# Patient Record
Sex: Female | Born: 1970 | Race: White | Hispanic: No | Marital: Married | Smoking: Never smoker
Health system: Southern US, Community
[De-identification: ages and names within clinical notes are randomized; demographics above are authoritative.]

## PROBLEM LIST (undated history)

## (undated) DIAGNOSIS — N83209 Unspecified ovarian cyst, unspecified side: Secondary | ICD-10-CM

## (undated) DIAGNOSIS — Z5189 Encounter for other specified aftercare: Secondary | ICD-10-CM

## (undated) DIAGNOSIS — A630 Anogenital (venereal) warts: Secondary | ICD-10-CM

## (undated) DIAGNOSIS — B977 Papillomavirus as the cause of diseases classified elsewhere: Secondary | ICD-10-CM

## (undated) DIAGNOSIS — E785 Hyperlipidemia, unspecified: Secondary | ICD-10-CM

## (undated) DIAGNOSIS — R030 Elevated blood-pressure reading, without diagnosis of hypertension: Secondary | ICD-10-CM

## (undated) DIAGNOSIS — Z87442 Personal history of urinary calculi: Secondary | ICD-10-CM

## (undated) DIAGNOSIS — D649 Anemia, unspecified: Secondary | ICD-10-CM

## (undated) DIAGNOSIS — C73 Malignant neoplasm of thyroid gland: Secondary | ICD-10-CM

## (undated) HISTORY — PX: FRACTURE SURGERY: SHX138

## (undated) HISTORY — DX: Encounter for other specified aftercare: Z51.89

## (undated) HISTORY — PX: BREAST SURGERY: SHX581

## (undated) HISTORY — DX: Anemia, unspecified: D64.9

## (undated) HISTORY — PX: COSMETIC SURGERY: SHX468

---

## 1999-10-31 HISTORY — PX: BREAST ENHANCEMENT SURGERY: SHX7

## 2002-10-07 ENCOUNTER — Other Ambulatory Visit: Admission: RE | Admit: 2002-10-07 | Discharge: 2002-10-07 | Payer: Self-pay | Admitting: Obstetrics and Gynecology

## 2003-04-06 ENCOUNTER — Inpatient Hospital Stay (HOSPITAL_COMMUNITY): Admission: AD | Admit: 2003-04-06 | Discharge: 2003-04-08 | Payer: Self-pay | Admitting: Obstetrics and Gynecology

## 2003-04-06 ENCOUNTER — Encounter: Payer: Self-pay | Admitting: Obstetrics and Gynecology

## 2003-04-06 ENCOUNTER — Encounter (INDEPENDENT_AMBULATORY_CARE_PROVIDER_SITE_OTHER): Payer: Self-pay

## 2003-05-19 ENCOUNTER — Other Ambulatory Visit: Admission: RE | Admit: 2003-05-19 | Discharge: 2003-05-19 | Payer: Self-pay | Admitting: Obstetrics and Gynecology

## 2003-12-01 ENCOUNTER — Other Ambulatory Visit: Admission: RE | Admit: 2003-12-01 | Discharge: 2003-12-01 | Payer: Self-pay | Admitting: Obstetrics and Gynecology

## 2005-02-03 ENCOUNTER — Encounter (INDEPENDENT_AMBULATORY_CARE_PROVIDER_SITE_OTHER): Payer: Self-pay | Admitting: Specialist

## 2005-02-03 ENCOUNTER — Ambulatory Visit (HOSPITAL_COMMUNITY): Admission: RE | Admit: 2005-02-03 | Discharge: 2005-02-03 | Payer: Self-pay | Admitting: Obstetrics and Gynecology

## 2005-02-03 HISTORY — PX: DILATION AND CURETTAGE OF UTERUS: SHX78

## 2005-09-04 ENCOUNTER — Ambulatory Visit (HOSPITAL_COMMUNITY): Admission: RE | Admit: 2005-09-04 | Discharge: 2005-09-04 | Payer: Self-pay | Admitting: Obstetrics and Gynecology

## 2006-09-18 ENCOUNTER — Inpatient Hospital Stay (HOSPITAL_COMMUNITY): Admission: AD | Admit: 2006-09-18 | Discharge: 2006-09-20 | Payer: Self-pay | Admitting: Obstetrics and Gynecology

## 2007-07-11 ENCOUNTER — Observation Stay (HOSPITAL_COMMUNITY): Admission: AD | Admit: 2007-07-11 | Discharge: 2007-07-12 | Payer: Self-pay | Admitting: Obstetrics and Gynecology

## 2007-07-11 ENCOUNTER — Inpatient Hospital Stay (HOSPITAL_COMMUNITY): Admission: AD | Admit: 2007-07-11 | Discharge: 2007-07-11 | Payer: Self-pay | Admitting: Obstetrics and Gynecology

## 2007-09-30 HISTORY — PX: TUBAL LIGATION: SHX77

## 2007-11-09 ENCOUNTER — Ambulatory Visit (HOSPITAL_COMMUNITY): Admission: RE | Admit: 2007-11-09 | Discharge: 2007-11-09 | Payer: Self-pay | Admitting: Obstetrics and Gynecology

## 2007-12-02 ENCOUNTER — Ambulatory Visit (HOSPITAL_COMMUNITY): Admission: RE | Admit: 2007-12-02 | Discharge: 2007-12-02 | Payer: Self-pay | Admitting: Obstetrics and Gynecology

## 2007-12-17 ENCOUNTER — Ambulatory Visit (HOSPITAL_COMMUNITY): Admission: RE | Admit: 2007-12-17 | Discharge: 2007-12-17 | Payer: Self-pay | Admitting: Obstetrics and Gynecology

## 2008-01-04 ENCOUNTER — Ambulatory Visit (HOSPITAL_COMMUNITY): Admission: RE | Admit: 2008-01-04 | Discharge: 2008-01-04 | Payer: Self-pay | Admitting: Obstetrics and Gynecology

## 2008-01-26 ENCOUNTER — Ambulatory Visit (HOSPITAL_COMMUNITY): Admission: RE | Admit: 2008-01-26 | Discharge: 2008-01-26 | Payer: Self-pay | Admitting: Obstetrics and Gynecology

## 2008-02-22 ENCOUNTER — Ambulatory Visit (HOSPITAL_COMMUNITY): Admission: RE | Admit: 2008-02-22 | Discharge: 2008-02-22 | Payer: Self-pay | Admitting: Obstetrics and Gynecology

## 2008-02-24 ENCOUNTER — Emergency Department: Payer: Self-pay | Admitting: Unknown Physician Specialty

## 2008-02-29 ENCOUNTER — Ambulatory Visit: Payer: Self-pay | Admitting: Obstetrics and Gynecology

## 2008-03-06 ENCOUNTER — Ambulatory Visit: Payer: Self-pay | Admitting: Obstetrics and Gynecology

## 2008-03-15 ENCOUNTER — Ambulatory Visit: Payer: Self-pay | Admitting: Family Medicine

## 2008-03-22 ENCOUNTER — Ambulatory Visit: Payer: Self-pay | Admitting: Obstetrics & Gynecology

## 2008-03-29 ENCOUNTER — Ambulatory Visit: Payer: Self-pay | Admitting: Obstetrics & Gynecology

## 2008-04-05 ENCOUNTER — Ambulatory Visit: Payer: Self-pay | Admitting: Obstetrics & Gynecology

## 2008-04-10 ENCOUNTER — Inpatient Hospital Stay (HOSPITAL_COMMUNITY): Admission: AD | Admit: 2008-04-10 | Discharge: 2008-04-13 | Payer: Self-pay | Admitting: Obstetrics and Gynecology

## 2008-04-10 ENCOUNTER — Encounter (INDEPENDENT_AMBULATORY_CARE_PROVIDER_SITE_OTHER): Payer: Self-pay | Admitting: Obstetrics and Gynecology

## 2008-11-02 IMAGING — US US OB FOLLOW-UP
1 series · 18 of 24 positions shown · non-contrast
Comparison: none

OBSTETRICAL ULTRASOUND:
 This ultrasound was performed in The [HOSPITAL], and the [facility] report will be stored to [REDACTED] PACS.

[Series 1: us ob follow-up · 18 of 24 slices shown]
[im 1/24]
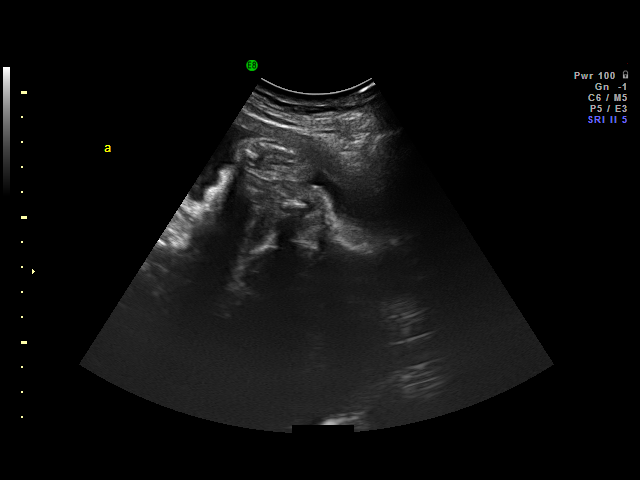
[im 3/24]
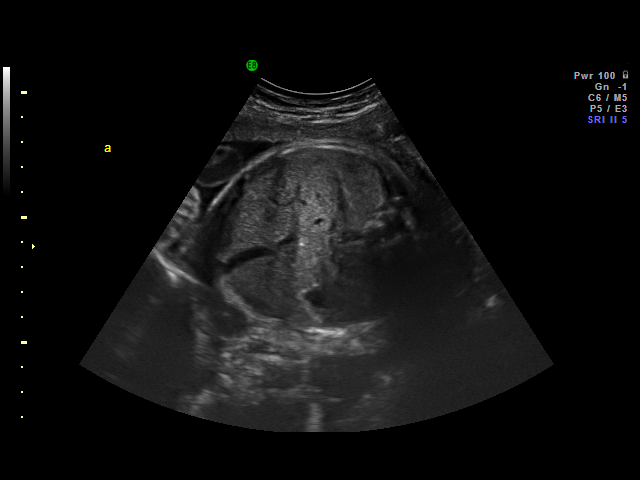
[im 4/24]
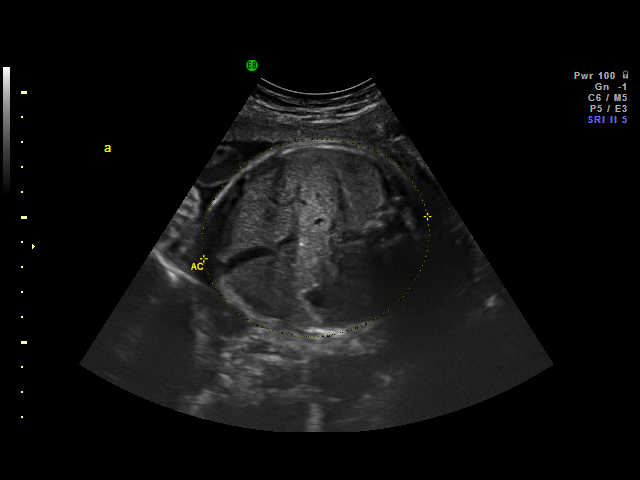
[im 5/24]
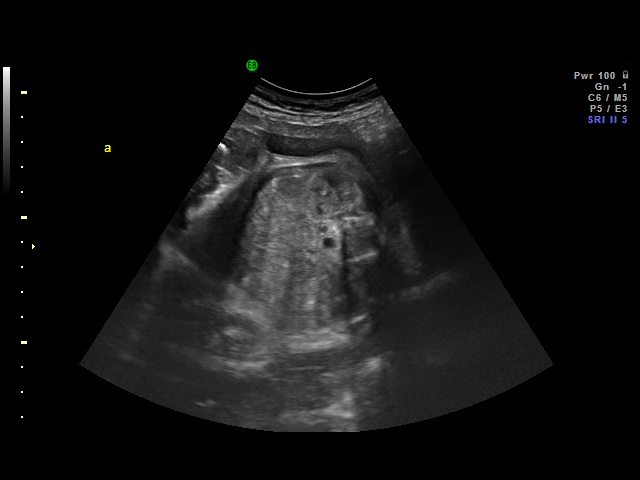
[im 7/24]
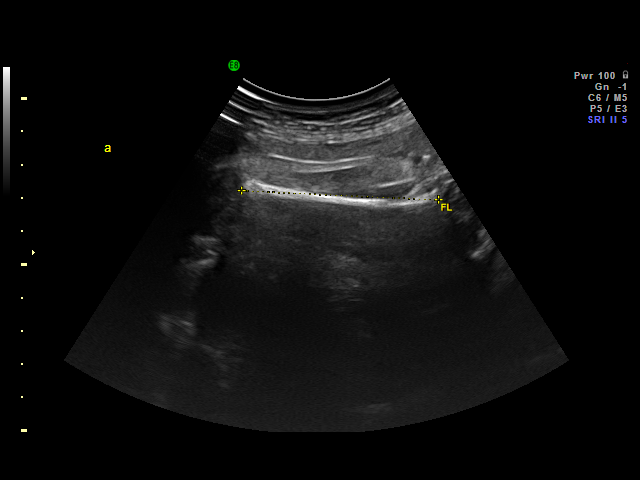
[im 8/24]
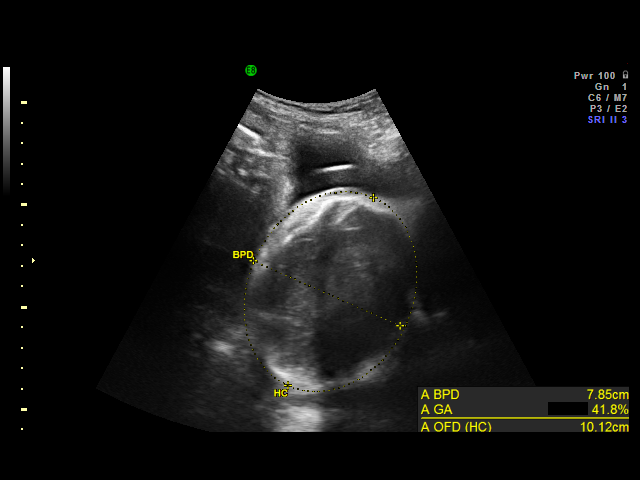
[im 9/24]
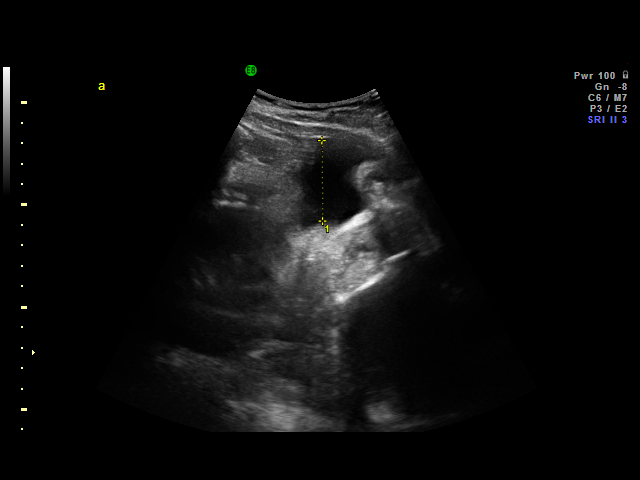
[im 11/24]
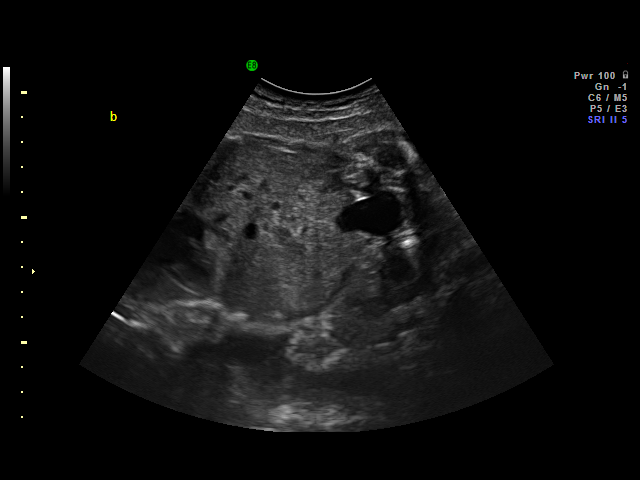
[im 12/24]
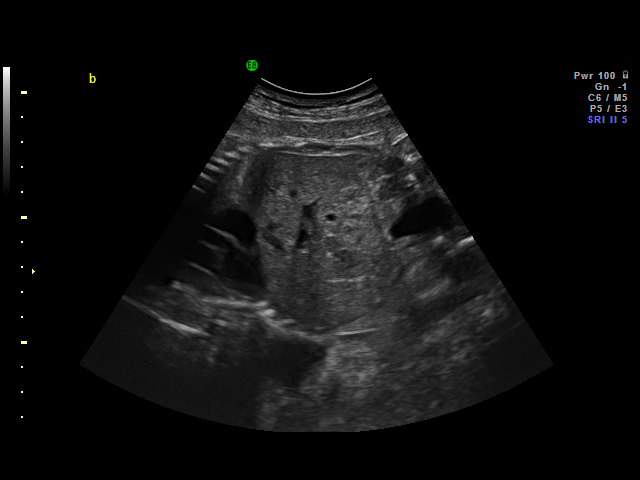
[im 13/24]
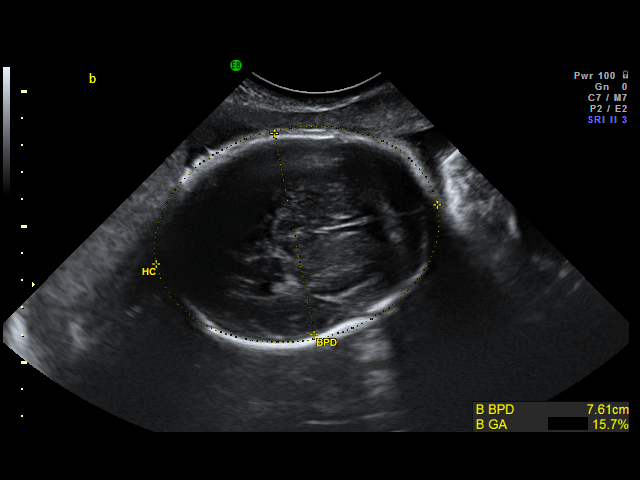
[im 15/24]
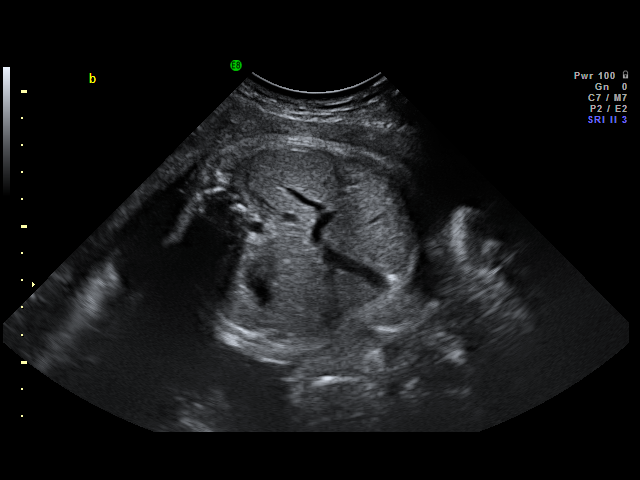
[im 16/24]
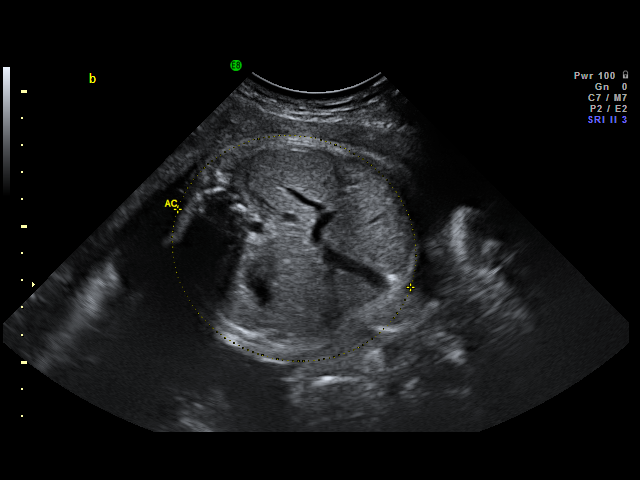
[im 17/24]
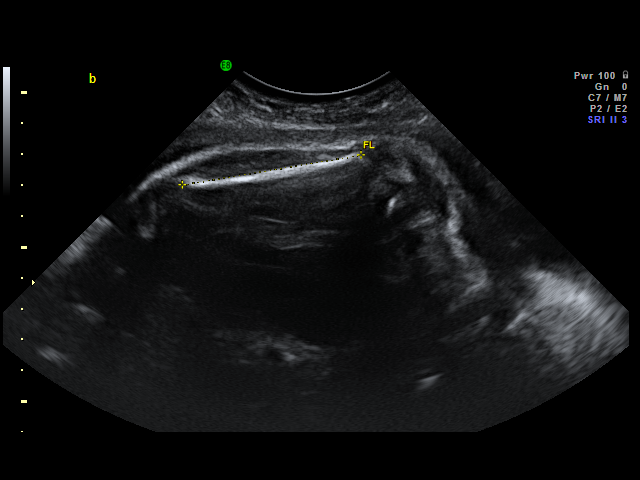
[im 19/24]
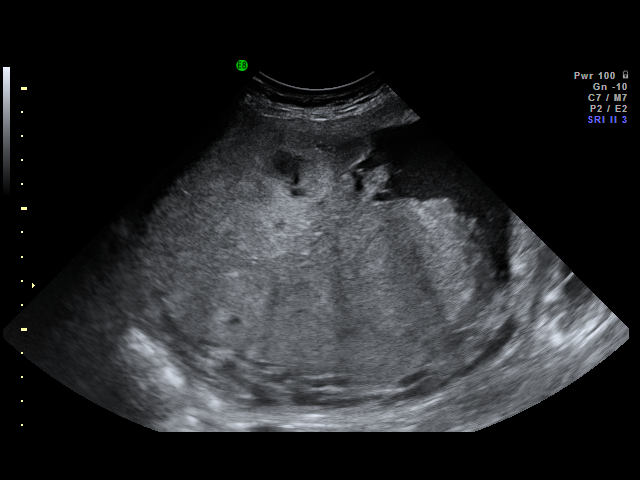
[im 20/24]
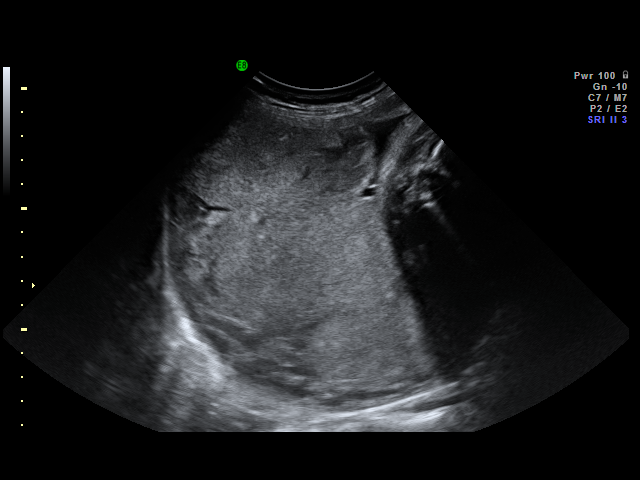
[im 21/24]
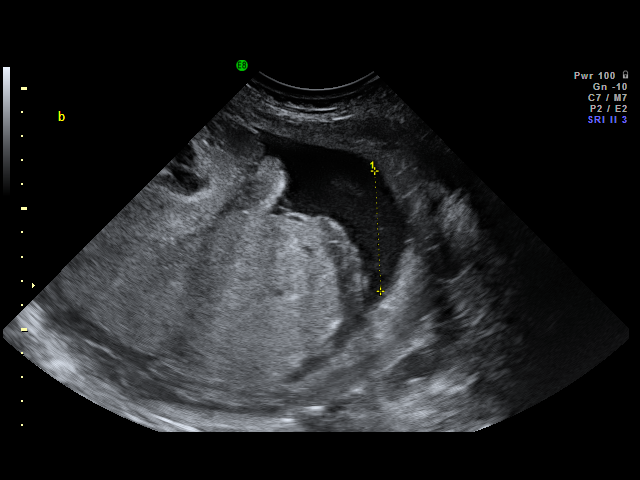
[im 23/24]
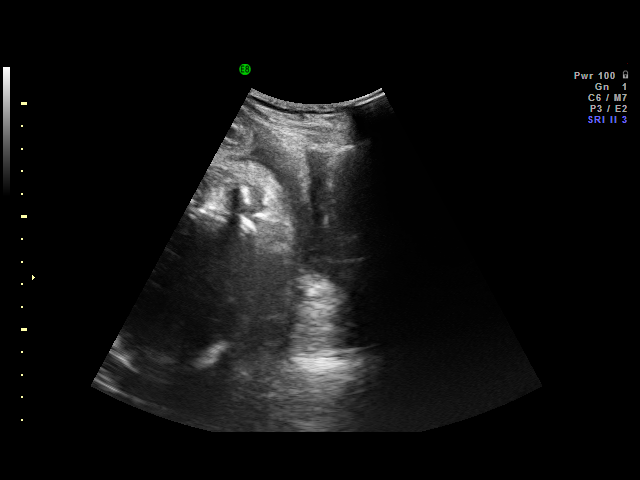
[im 24/24]
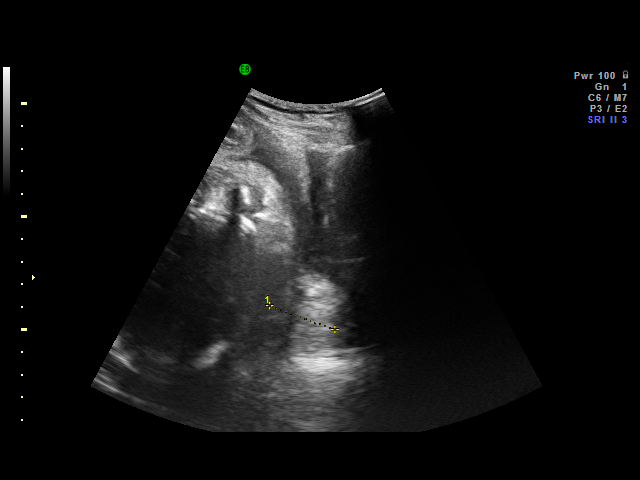

[18 of 24 positions shown; findings below may reference images not displayed]

IMPRESSION: The [facility] report has also been faxed to the ordering physician.

## 2008-11-04 IMAGING — CR DG ANKLE 2V *L*
1 series · 2 of 2 positions shown · non-contrast
Comparison: none

REASON FOR EXAM: injury, unable to wt. bear.  Pt is 32 wks preg.  Shield
abd   Minor Care 4
COMMENTS:   LMP: pregnant

PROCEDURE:     DXR - DXR ANKLE LEFT AP AND LATERAL  - February 24, 2008  [DATE]
RESULT:     AP and lateral views of the LEFT ankle reveal the bones to be
adequately mineralized. There is no evidence of fracture nor dislocation on
these two views.

[Series 1: view not recorded · 0.17mm/px · 2 of 2 slices shown]
[im 1/2]
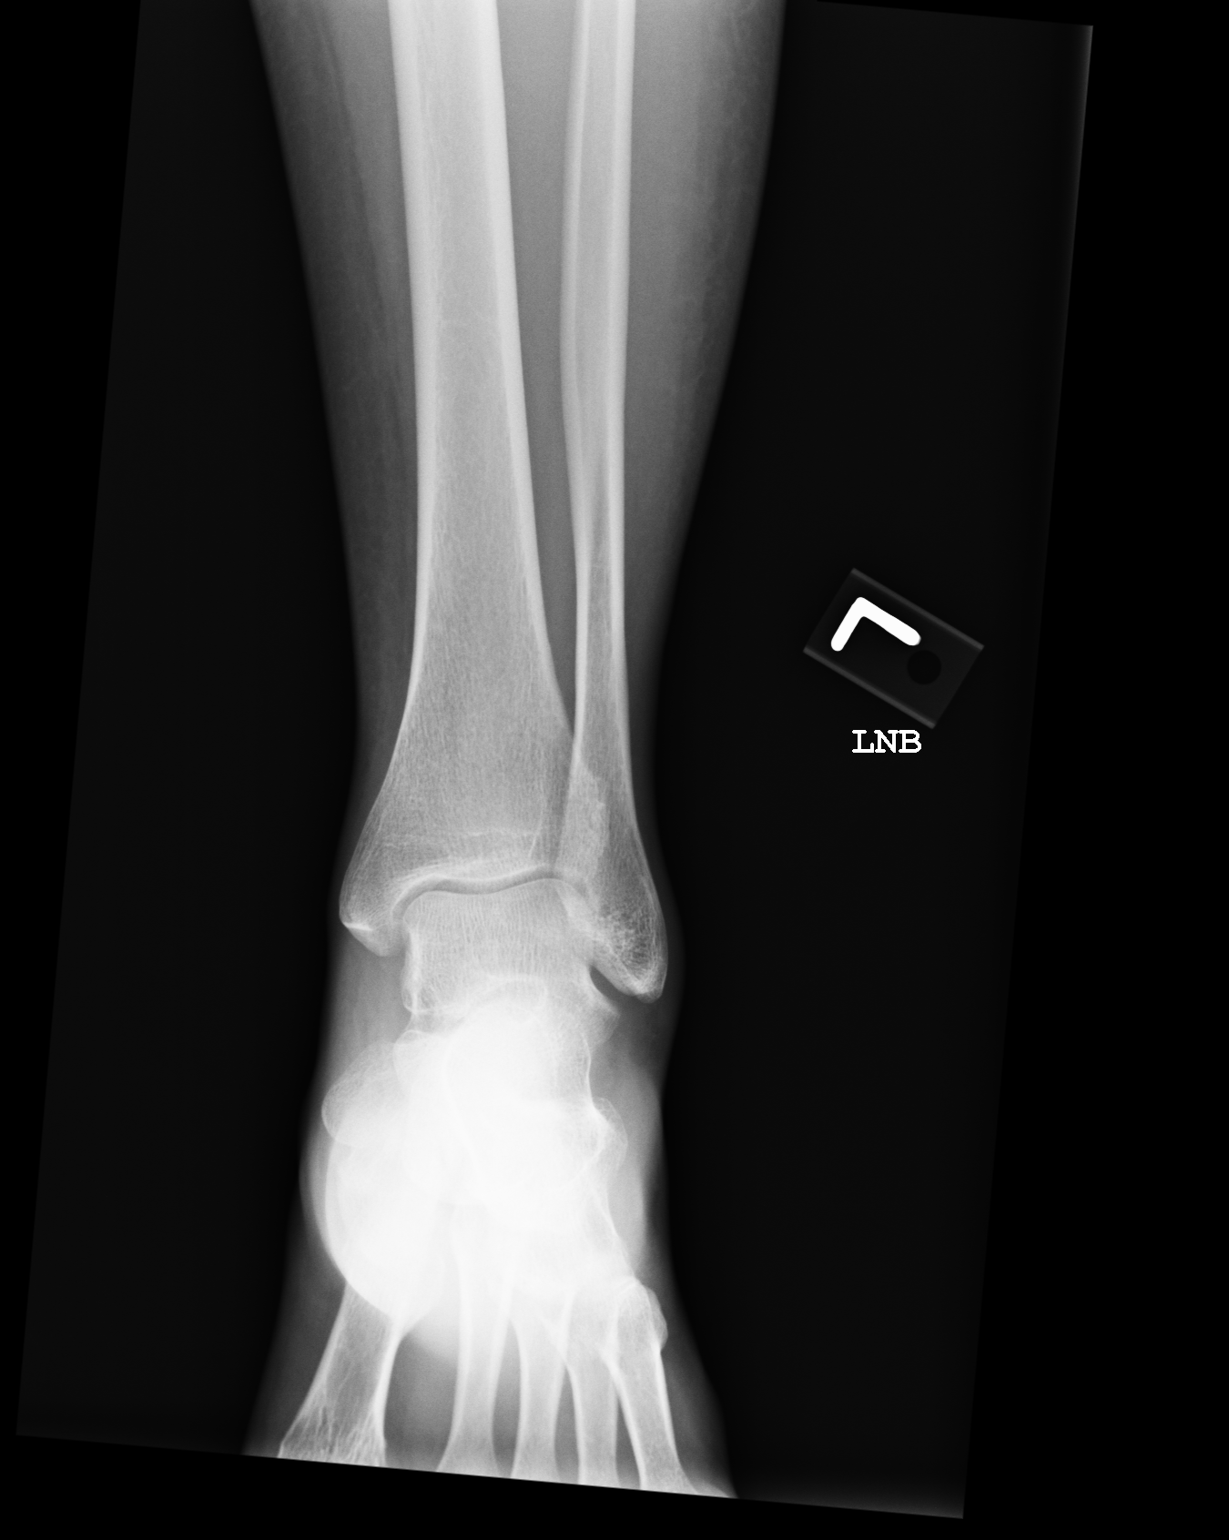
[im 2/2]
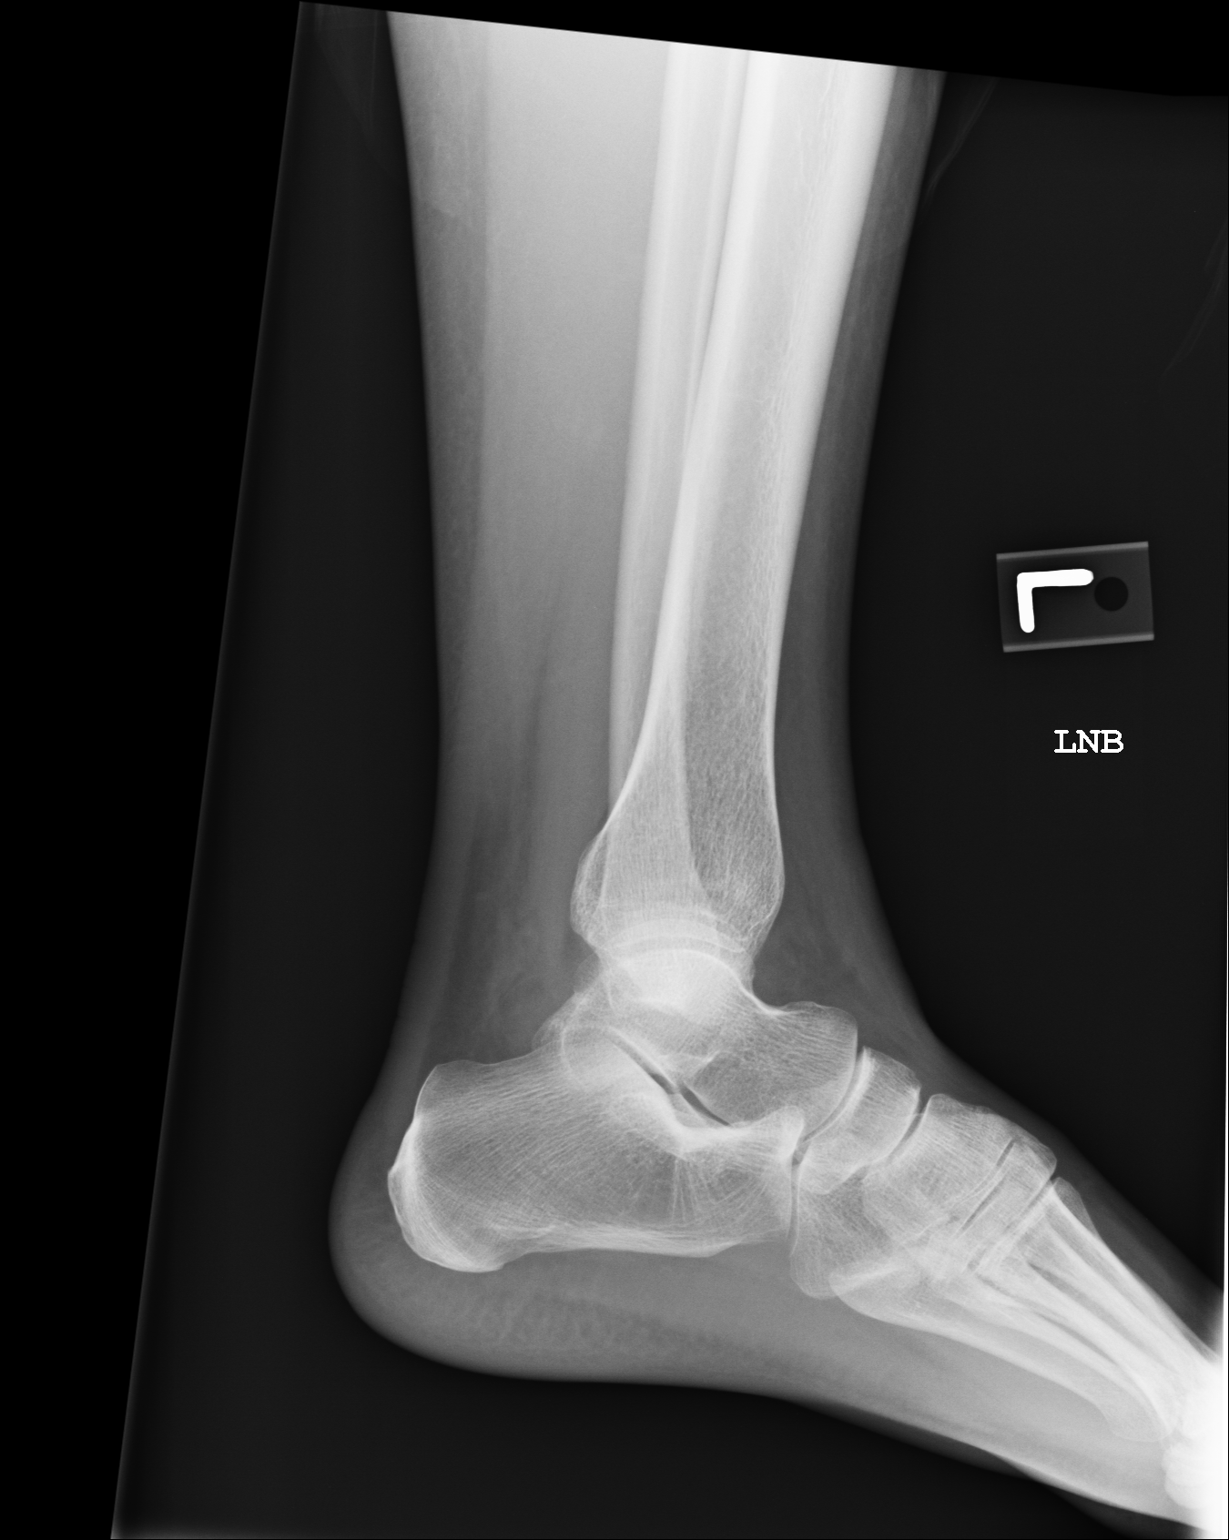

[2 of 2 positions shown; findings below may reference images not displayed]

IMPRESSION: I do not see objective evidence of acute fracture. On the
frontal film, there is a tiny bony density just inferior to the tip of the
medial malleolus, but I do not see significant overlying soft tissue
swelling. Correlation with the patient's symptoms would be of value.
Followup imaging is available upon request.

## 2010-09-29 HISTORY — PX: LEEP: SHX91

## 2011-02-11 NOTE — Op Note (Signed)
NAME:  Alexandra [name], Alexandra [name]             ACCOUNT NO.:  [id number]   MEDICAL RECORD NO.:  [id number]          PATIENT TYPE:  INP   LOCATION:  9122                          FACILITY:  [facility]   PHYSICIAN:  Maxie Better, M.D.DATE OF BIRTH:  [birth year]   DATE OF PROCEDURE:  DATE OF DISCHARGE:                               OPERATIVE REPORT   PREOPERATIVE DIAGNOSES:  Spontaneous rupture of membranes, twin  gestation at 64 and 1/7 weeks, footling breech presentation second twin,  and desires sterilization.   PROCEDURE:  Primary cesarean section, Kerr hysterotomy, and modified  Pomeroy tubal ligation.   POSTOPERATIVE DIAGNOSES:  Spontaneous rupture of membranes. footling  breech presentation second twin, twin gestation at 27 and 1/7 weeks, and  desires sterilization.   ANESTHESIA:  Spinal.   SURGEON:  Maxie Better, MD   ASSISTANT:  Chester Holstein. Earlene Plater, MD   INDICATIONS:  A 40 year old gravida 7, para 2-0-4-2, married white  female at 45 and 1/7 weeks with known monochorionic diamniotic twin  gestation who presented with spontaneous rupture of membranes, clear  fluid at 2:35 a.m.  The patient underwent an ultrasound that confirmed a  footling breech presentation of the second twin.  Given that finding,  recommendation was made for a primary cesarean section.  The patient  also had expressed desire for permanent sterilization.  Consent was  signed.  The patient was transferred to the operating room.   PROCEDURE:  Under adequate spinal anesthesia, the patient was placed in  the supine position with a left lateral tilt.  She was sterilely prepped  and draped in the usual fashion.  Indwelling Foley catheter was  sterilely placed.  Marcaine 0.25% was injected along the planned  Pfannenstiel skin incision site.  Pfannenstiel skin incision was then  made, carried down to the rectus fascia.  The rectus fascia was opened  transversely.  The rectus fascia was then bluntly and sharply dissected  off the rectus muscle in a superior and inferior fashion.  The rectus  muscle was split in the midline.  The parietal peritoneum was entered  bluntly and extended.  The vesicouterine peritoneum was opened  transversely.  The bladder was bluntly dissected off the lower uterine  segment.  A curvilinear low transverse uterine incision was then made  and extended with bandage scissors.  On entering the uterine cavity, the  foot from the second twin was present.  This resulted in decision to  make a delivery of the second twin as the first from a footling breech  using the usual breech maneuvers.  Baby was bulb suctioned in the  abdomen.  Cord was clamped and cut.  The baby was transferred to the  awaiting pediatrician who assigned Apgars of 9 and 9 at 1 and 5 minutes.  Artificial rupture of membranes was then performed on the sac of this  presenting twin and subsequent delivery of a live female from a vertex  position for a second twin was accomplished.  Baby was bulb suctioned in  the abdomen.  Cord was clamped and cut.  The baby was transferred to the  awaiting pediatrician who assigned  Apgars of 8 and 9 at 1 and 5 minutes.  The placenta was removed.  Uterine cavity was cleaned of debris.  Placenta was sent to pathology.  Uterine incision was closed with 0  Monocryl running locked stitch.  Good hemostasis was initially noted.  Attention was then turned to the fallopian tube.  The uterus was  inspected.  Both ovaries were normal.  Midportion of both fallopian  tubes was grasped with a Babcock.  The underlying mesosalpinx was then  opened with cautery.  The proximal and distal portion of tube was tied  with 0 chromic sutures x2 proximally and distally and the intervening  segment of tube was then removed.  The abdomen was then copiously  irrigated and suctioned debris.  Bleeding in the lower inferior aspect  of the uterus resulted in several figure-of-eight 0 Vicryl suture  placement with  ultimate hemostasis noted.  The incision itself was  otherwise unremarkable and bleeding along the peritoneal edges of the  bladder was cauterized.  With good hemostasis noted, the parietal  peritoneum was closed with 2-0 Vicryl and the rectus fascia with 0  Vicryl x2.  The skin was approximated with Ethibond staples.   SPECIMEN:  Placenta x2 and portion of right and left fallopian tube all  sent to pathology.   ESTIMATED BLOOD LOSS:  800 mL.   INTRAOPERATIVE FLUIDS:  2800.   URINE OUTPUT:  250 mL clear yellow urine.   Sponge and instrument counts x2 was correct.   COMPLICATIONS:  None.   Weight of baby A was 6 pounds 6 and baby B 6 pounds.  The patient  tolerated the procedure well and was transferred to recovery room in  stable condition.      Maxie Better, M.D.  Electronically Signed     [place]/MEDQ  D:  04/10/2008  T:  04/11/2008  Job:  [id number]

## 2011-02-14 NOTE — H&P (Signed)
   NAME:  Alexandra [name], Alexandra [name]                       ACCOUNT NO.:  [id number]   MEDICAL RECORD NO.:  [id number]                   PATIENT TYPE:  MAT   LOCATION:  MATC                                 [facility]ACILITY:  [facility]   PHYSICIAN:  Lenoard Aden, M.D.             DATE OF BIRTH:  1971-02-04   DATE OF ADMISSION:  04/06/2003  DATE OF DISCHARGE:                                HISTORY & PHYSICAL   CHIEF COMPLAINT:  Bleeding.   HISTORY OF PRESENT ILLNESS:  The patient is a 40 year old white female,  gravida 1, para 0, EDD of April 07, 2003, at 39-6/7 weeks who presents with  blood-tinged staining on her sheets this morning. She was evaluated in the  office with no active bleeding.  Sent to the hospital for evaluation.   PAST MEDICAL HISTORY:  Noncontributory.   PAST OBSTETRICAL HISTORY:  Noncontributory.   SOCIAL HISTORY:  She is a nonsmoker and nondrinker. Denies domestic or  physical violence.   REVIEW OF SYSTEMS:  Otherwise negative.  Fetal heart rate tracing is  reactive with irregular contractions noted.   PHYSICAL EXAMINATION:  LUNGS:  Clear.  HEENT:  Normal.  HEART:  Regular rate and rhythm.  ABDOMEN: Soft, gravid, and nontender.  Fundus nontender.  No right upper  quadrant tenderness.  PELVIC:  Deferred, 2, 80%, and vertex -1 in the office.  EXTREMITIES:  No cords.  NEUROLOGY:  Nonfocal.   Ultrasound reveals a normal AFI of 9.65 with an anterior placenta. No  evidence of retroplacental clot or abruptio.   IMPRESSION:  1. 39-6/7 weeks intrauterine pregnancy.  2. Third trimester bleeding probably related to early labor pattern, no     evidence of abruption.   PLAN:  Prolonged monitoring.  Admission for labor. Possible admission if  bleeding persists.  Follow-up evaluation to be performed by Dr. Cherly Hensen  prior to discharge home.                                               Lenoard Aden, M.D.    [facility]/MEDQ  D:  04/06/2003  T:  04/06/2003  Job:  [id number]

## 2011-02-14 NOTE — Op Note (Signed)
NAMEMAKENLEY, SHIMP             ACCOUNT NO.:  [id number]   MEDICAL RECORD NO.:  [id number]          PATIENT TYPE:  AMB   LOCATION:  SDC                           [facility]   PHYSICIAN:  Maxie Better, M.D.DATE OF BIRTH:  April 20, 1971   DATE OF PROCEDURE:  02/03/2005  DATE OF DISCHARGE:                                 OPERATIVE REPORT   PREOPERATIVE DIAGNOSIS:  Abnormal intrauterine pregnancy, rule out molar  pregnancy, first trimester vaginal bleeding.   POSTOPERATIVE DIAGNOSIS:  Abnormal intrauterine pregnancy, rule out molar  pregnancy, first trimester vaginal bleeding, pending final pathology.   PROCEDURE:  Suction dilation and evacuation.   ANESTHESIA:  MAC and paracervical block.   SURGEON:  Maxie Better, M.D.   INDICATIONS FOR PROCEDURE:  This is a 40 year old gravida 2, para 1, female  at 72 weeks by LMP who was found on her obstetrical visit on Jan 30, 2005, to  be sized less than data who then underwent an ultrasound which revealed  cystic structures in the endometrium without a defined gestational sac who  now presents for surgical management.  The patient began having vaginal  bleeding on Feb 02, 2005.  Her quantitative HCG prior to the surgery and  prior to bleeding had been 7100 in the office.  The risks and benefits of  the procedure were explained to the patient.  Preop labs and chest x-ray  were done in the event that this might be a molar pregnancy.  The patient's  blood type is O positive.  She was transferred to the operating room.   PROCEDURE:  Under adequate monitored anesthesia, the patient was placed in  the dorsal lithotomy position.  She was sterilely prepped and draped in the  usual fashion.  The vulva had been noted to have some blood.  Bimanual  examination revealed an anteverted uterus, slightly enlarged, no adnexal  masses could be appreciated.  The bladder was catheterized for a small  amount of urine.  A bivalve speculum was placed  in the vagina.  20 mL of 1%  Nesacaine was injected paracervically at 3 and 9 o'clock.  The anterior lip  of the cervix was grasped with a single tooth tenaculum.  The cervix was  then serially dilated up to a #31 Pratt dilator.  A #7 mm curved suction  cannula was introduced into the uterine cavity.  Moderate amounts of tissue  was obtained.  The cavity underwent sharp curettage and then suction.  When  all tissue was felt to have been removed, all instruments were removed from  the vagina.  The specimen was labeled products of conception and was sent to  pathology.  Estimated blood loss minimal.  Complications were none.  The  patient tolerated the procedure well and was transferred to the recovery  room in stable condition.     [place]/MEDQ  D:  02/03/2005  T:  02/03/2005  Job:  [id number]

## 2011-02-14 NOTE — Discharge Summary (Signed)
Alexandra [name], Alexandra [name]             ACCOUNT NO.:  [id number]   MEDICAL RECORD NO.:  [id number]          PATIENT TYPE:  INP   LOCATION:  9122                          [facility]   PHYSICIAN:  Maxie Better, M.D.DATE OF BIRTH:  September 05, 1971   DATE OF ADMISSION:  04/10/2008  DATE OF DISCHARGE:  04/13/2008                               DISCHARGE SUMMARY   ADMISSION DIAGNOSES:  1. Twin gestation.  2. Spontaneous rupture of membranes.  3. Desires sterilization.  4. On presentation second twin footling breech.   DISCHARGE DIAGNOSES:  1. Twin gestation delivered.  2. Desires sterilization.  3. Spontaneous rupture of membranes  4. Malpresentation 2nd twin   PROCEDURES:  Primary cesarean section and modified Pomeroy tubal  ligation.   HISTORY OF PRESENT ILLNESS:  A 40 year old gravida 7, para 2-0-4-2  female at 62 plus weeks gestation with known twin gestation, presented  with spontaneous rupture of membranes.  The patient was shown to have  vertex footling breech presentation.  She was 4, 6, and minus 2.  The  patient expressed desire for permanent sterilization.  She was taken to  the operating room.  Live female x2 were delivered; 6 pounds, 6 pounds 6  ounces; Apgars were 9 and 9, and 8 and 9 respectively.  See the dictated  operative report for more details.  The patient had an uncomplicated  postoperative course.  Her CBC on postop day #1 showed a hemoglobin of  10, hematocrit 28.3, white count 11, and platelet count 178,000.  The  patient was considered well for discharge on postop day #3.   DISPOSITION:  Home.   CONDITION:  Stable.   DISCHARGE MEDICATIONS:  1. Tylox 1-2 tablets every 3-4 hours p.r.n. pain.  2. Prenatal vitamins 1 p.o. daily.  3. Motrin 800 mg one every 6 hours p.r.n. pain.   FOLLOWUP APPOINTMENTS:  [facility] in 6 weeks.   DISCHARGE INSTRUCTIONS:  Per the postpartum booklet given.      Maxie Better, M.D.  Electronically Signed     [place]/MEDQ  D:  05/04/2008  T:  05/04/2008  Job:  [id number]

## 2011-06-26 LAB — CBC
HCT: 28.3 — ABNORMAL LOW
HCT: 32.5 — ABNORMAL LOW
Hemoglobin: 10 — ABNORMAL LOW
Hemoglobin: 11.3 — ABNORMAL LOW
MCHC: 34.8
MCHC: 35.4
MCV: 92
MCV: 95.5
Platelets: 178
Platelets: 184
RBC: 2.96 — ABNORMAL LOW
RBC: 3.53 — ABNORMAL LOW
RDW: 13.4
RDW: 13.6
WBC: 11.1 — ABNORMAL HIGH
WBC: 8.3

## 2011-06-26 LAB — RPR: RPR Ser Ql: NONREACTIVE

## 2011-07-10 LAB — CROSSMATCH
ABO/RH(D): O POS
Antibody Screen: NEGATIVE

## 2011-07-10 LAB — POCT PREGNANCY, URINE
Operator id: 13344
Preg Test, Ur: NEGATIVE

## 2011-07-10 LAB — CBC
HCT: 21.9 — ABNORMAL LOW
HCT: 27.4 — ABNORMAL LOW
HCT: 27.5 — ABNORMAL LOW
Hemoglobin: 9.4 — ABNORMAL LOW
MCHC: 34.1
MCHC: 34.5
MCHC: 34.7
MCV: 82.7
MCV: 82.9
MCV: 83.3
Platelets: 345
Platelets: 400
RBC: 2.64 — ABNORMAL LOW
RBC: 3.31 — ABNORMAL LOW
RDW: 12.9
RDW: 14
WBC: 10.1
WBC: 9.6

## 2011-07-10 LAB — SAMPLE TO BLOOD BANK

## 2011-07-10 LAB — HCG, SERUM, QUALITATIVE: Preg, Serum: NEGATIVE

## 2012-01-28 ENCOUNTER — Ambulatory Visit: Payer: Self-pay | Admitting: Obstetrics and Gynecology

## 2012-12-25 ENCOUNTER — Emergency Department: Payer: Self-pay | Admitting: Emergency Medicine

## 2012-12-25 LAB — URINALYSIS, COMPLETE
Bacteria: NONE SEEN
Leukocyte Esterase: NEGATIVE
Ph: 6 (ref 4.5–8.0)
Squamous Epithelial: 3

## 2012-12-25 LAB — COMPREHENSIVE METABOLIC PANEL
Alkaline Phosphatase: 62 U/L (ref 50–136)
Bilirubin,Total: 0.2 mg/dL (ref 0.2–1.0)
EGFR (African American): >60
EGFR (Non-African Amer.): >60
Osmolality: 277 (ref 275–301)
Potassium: 3.3 mmol/L — ABNORMAL LOW (ref 3.5–5.1)
SGPT (ALT): 25 U/L (ref 12–78)
Sodium: 138 mmol/L (ref 136–145)
Total Protein: 7.5 g/dL (ref 6.4–8.2)

## 2012-12-25 LAB — CBC: RBC: 4.19 10*6/uL (ref 3.80–5.20)

## 2013-02-01 ENCOUNTER — Ambulatory Visit: Payer: Self-pay | Admitting: Obstetrics and Gynecology

## 2014-03-02 ENCOUNTER — Ambulatory Visit: Payer: Self-pay | Admitting: Obstetrics and Gynecology

## 2015-03-29 DIAGNOSIS — B977 Papillomavirus as the cause of diseases classified elsewhere: Secondary | ICD-10-CM | POA: Insufficient documentation

## 2015-03-29 DIAGNOSIS — A630 Anogenital (venereal) warts: Secondary | ICD-10-CM | POA: Insufficient documentation

## 2015-03-29 DIAGNOSIS — N870 Mild cervical dysplasia: Secondary | ICD-10-CM | POA: Insufficient documentation

## 2015-03-29 DIAGNOSIS — O039 Complete or unspecified spontaneous abortion without complication: Secondary | ICD-10-CM | POA: Insufficient documentation

## 2015-03-29 DIAGNOSIS — D649 Anemia, unspecified: Secondary | ICD-10-CM | POA: Insufficient documentation

## 2015-03-29 DIAGNOSIS — N83209 Unspecified ovarian cyst, unspecified side: Secondary | ICD-10-CM | POA: Insufficient documentation

## 2015-03-29 DIAGNOSIS — T148XXA Other injury of unspecified body region, initial encounter: Secondary | ICD-10-CM | POA: Insufficient documentation

## 2015-03-29 DIAGNOSIS — Z7983 Long term (current) use of bisphosphonates: Secondary | ICD-10-CM | POA: Insufficient documentation

## 2015-04-06 ENCOUNTER — Ambulatory Visit: Payer: Self-pay | Admitting: Family Medicine

## 2020-01-05 ENCOUNTER — Telehealth: Payer: Self-pay

## 2020-01-05 NOTE — Telephone Encounter (Signed)
Copied from [place] [phone]. Topic: Appointment Scheduling - Scheduling Inquiry for Clinic >> Jan 05, 2020 10:26 AM Richardo Priest, NT wrote: Reason for CRM: Patient called in stating she is needing a referral due to having rectal bleeding for about a week to see Dr.Wall. Patient states it has been a few summers since she has been seen in office with [facility], so would like to know what can be done, since she was last seen by [employer]. Please advise.

## 2020-01-05 NOTE — Telephone Encounter (Signed)
Patient advised as below. She denies having any large amounts of rectal bleeding, weakness of dizziness. Appointment scheduled for tomorrow 01/06/2020 at 2pm.

## 2020-01-05 NOTE — Telephone Encounter (Signed)
She has not been seen in almost 5.5 years. Her last office visit was 08/21/2014. I advised patient that she is no longer considered a patient because of this, and she would have to establish care with one of the other providers who are accepting new patients. There are no new patient appointments available until next month with [facility]. Patient has an urgent concern and wants to be seen sooner. She says she is scared of what it could be. Please advise if you are willing to reestablish care with patient. She wants to be seen asap. She says she never established care with a different PCP. She only received care with her GYN.

## 2020-01-05 NOTE — Telephone Encounter (Signed)
If having significant bleeding, weakness or dizziness, may need to go to the ER or an Urgent Care Clinic. If passing bright red blood, this may be a hemorrhoid that needs treatment. May schedule appointment tomorrow with me if able to wait.

## 2020-01-06 ENCOUNTER — Other Ambulatory Visit: Payer: Self-pay

## 2020-01-06 ENCOUNTER — Ambulatory Visit (INDEPENDENT_AMBULATORY_CARE_PROVIDER_SITE_OTHER): Payer: BC Managed Care – PPO | Admitting: Family Medicine

## 2020-01-06 ENCOUNTER — Encounter: Payer: Self-pay | Admitting: Family Medicine

## 2020-01-06 VITALS — BP 121/83 | HR 94 | Temp 97.5°F | Resp 16 | Ht 65.0 in | Wt 136.0 lb

## 2020-01-06 DIAGNOSIS — K602 Anal fissure, unspecified: Secondary | ICD-10-CM

## 2020-01-06 MED ORDER — HYDROCORTISONE ACE-PRAMOXINE 1-1 % EX CREA: 1.0000 "application " | TOPICAL_CREAM | Freq: Two times a day (BID) | CUTANEOUS | 0 refills | Status: DC

## 2020-01-06 NOTE — Patient Instructions (Signed)
Anal Fissure, Adult  An anal fissure is a small tear or crack in the tissue of the anus. Bleeding from a fissure usually stops on its own within a few minutes. However, bleeding will often occur again with each bowel movement until the fissure heals. What are the causes? This condition is usually caused by passing a large or hard stool (feces). Other causes include:  Constipation.  Frequent diarrhea.  Inflammatory bowel disease (Crohn's disease or ulcerative colitis).  Childbirth.  Infections.  Anal sex. What are the signs or symptoms? Symptoms of this condition include:  Bleeding from the rectum.  Small amounts of blood seen on your stool, on the toilet paper, or in the toilet after a bowel movement. The blood coats the outside of the stool and is not mixed with the stool.  Painful bowel movements.  Itching or irritation around the anus. How is this diagnosed? A health care provider may diagnose this condition by closely examining the anal area. An anal fissure can usually be seen with careful inspection. In some cases, a rectal exam may be performed, or a short tube (anoscope) may be used to examine the anal canal. How is this treated? Initial treatment for this condition may include:  Taking steps to avoid constipation. This may include making changes to your diet, such as increasing your intake of fiber or fluid.  Taking fiber supplements. These supplements can soften your stool to help make bowel movements easier. Your health care provider may also prescribe a stool softener if your stool is hard.  Taking sitz baths. This may help to heal the tear.  Using medicated creams or ointments. These may be prescribed to lessen discomfort. Treatments that are sometimes used if initial treatments do not work well or if the condition is more severe may include:  Botulinum injection.  Surgery to repair the fissure. Follow these instructions at home: Eating and  drinking   Avoid foods that may cause constipation, such as bananas, milk, and other dairy products.  Eat all fruits, except bananas.  Drink enough fluid to keep your urine pale yellow.  Eat foods that are high in fiber, such as beans, whole grains, and fresh fruits and vegetables. General instructions   Take over-the-counter and prescription medicines only as told by your health care provider.  Use creams or ointments only as told by your health care provider.  Keep the anal area clean and dry.  Take sitz baths as told by your health care provider. Do not use soap in the sitz baths.  Keep all follow-up visits as told by your health care provider. This is important. Contact a health care provider if you have:  More bleeding.  A fever.  Diarrhea that is mixed with blood.  Pain that continues.  Ongoing problems that are getting worse rather than better. Summary  An anal fissure is a small tear or crack in the tissue of the anus. This condition is usually caused by passing a large or hard stool (feces). Other causes include constipation and frequent diarrhea.  Initial treatment for this condition may include taking steps to avoid constipation, such as increasing your intake of fiber or fluid.  Follow instructions for care as told by your health care provider.  Contact your health care provider if you have more bleeding or your problem is getting worse rather than better.  Keep all follow-up visits as told by your health care provider. This is important. This information is not intended to replace advice given  to you by your health care provider. Make sure you discuss any questions you have with your health care provider. Document Revised: 02/25/2018 Document Reviewed: 02/25/2018 Elsevier Patient Education  [employer].

## 2020-01-06 NOTE — Progress Notes (Signed)
Patient: Alexandra [name] Female    DOB: 1971-03-03   49 y.o.   MRN: [id number] Visit Date: 01/06/2020  Today's Provider: Vernie Murders, PA   Chief Complaint  Patient presents with  . Establish Care   Subjective:     HPI: This 49 year old female had pain with a BM 1 weeks ago and saw bright red blood in the stool. Some itching after BM. History of paternal grandfather had a colon tumor at his death at age 64. History of irregular menses with the last one November 2020.  Past Medical History:  Diagnosis Date  . Anemia   . Blood transfusion without reported diagnosis    Patient Active Problem List   Diagnosis Date Noted  . Abortion, spontaneous 03/29/2015  . 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 Personal history of ongoing treatment with alendronate (Fosamax) 03/29/2015  . Micropuncture 03/29/2015  . Cyst of ovary 03/29/2015   Family History  Problem Relation Age of Onset  . Hyperlipidemia Brother   . Heart attack Mother   . Hashimoto's thyroiditis Mother   . Dementia Mother   . Hypertension Mother   . Hyperlipidemia Mother   . Migraines Mother   . Neurologic Disorder Father   . Cancer Paternal Grandmother   . Kidney cancer Son   . Heart disease Maternal Grandfather   . Cancer Paternal Grandfather    No Known Allergies  Current Outpatient Medications:  .  folic acid (FOLVITE) 1 MG tablet, Take 1 mg by mouth daily., Disp: , Rfl:  .  Multiple Vitamin (MULTIVITAMIN PO), Take by mouth., Disp: , Rfl:   Review of Systems  Gastrointestinal: Positive for blood in stool.  All other systems reviewed and are negative.   Social History   Tobacco Use  . Smoking status: Never Smoker  . Smokeless tobacco: Never Used  Substance Use Topics  . Alcohol use: No    Alcohol/week: 0.0 standard drinks      Objective:   BP 121/83 (BP Location:  Right Arm, Patient Position: Sitting, Cuff Size: Large)   Pulse 94   Temp (!) 97.5 F (36.4 C) (Other (Comment))   Resp 16   Ht 5' 5"  (1.651 m)   Wt 136 lb (61.7 kg)   LMP 08/08/2019 (Approximate)   SpO2 98%   BMI 22.63 kg/m  Vitals:   01/06/20 1430  BP: 121/83  Pulse: 94  Resp: 16  Temp: (!) 97.5 F (36.4 C)  TempSrc: Other (Comment)  SpO2: 98%  Weight: 136 lb (61.7 kg)  Height: 5' 5"  (1.651 m)  Body mass index is 22.63 kg/m.  Physical Exam Constitutional:      General: She is not in acute distress.    Appearance: She is well-developed.  HENT:     Head: Normocephalic and atraumatic.     Right Ear: Hearing normal.     Left Ear: Hearing normal.     Nose: Nose normal.  Eyes:     General: Lids are normal. No scleral icterus.       Right eye: No discharge.        Left eye: No discharge.     Conjunctiva/sclera: Conjunctivae normal.  Cardiovascular:     Rate and Rhythm: Normal rate and regular rhythm.     Heart sounds: Normal heart sounds.  Pulmonary:     Effort: Pulmonary effort  is normal. No respiratory distress.     Breath sounds: Normal breath sounds.  Abdominal:     General: Bowel sounds are normal.     Palpations: Abdomen is soft.  Genitourinary:    Rectum: Guaiac result positive.     Comments: No external hemorrhoid thrombosis. One small bluish vein internally at 7 o'clock and a small bleeding fissure at 6 o'clock by anoscope.  Musculoskeletal:        General: Normal range of motion.  Skin:    Findings: No lesion or rash.  Neurological:     Mental Status: She is alert and oriented to person, place, and time.  Psychiatric:        Speech: Speech normal.        Behavior: Behavior normal.        Thought Content: Thought content normal.       Assessment & Plan    1. Anal fissure Noticed some bright red blood on tissue after a painful BM a week ago. No diarrhea or significant constipation. Fissure seen by anoscopic exam. Will treat with hot soaks and  Proctocream-HC BID. Recheck if no improvement in the next 7-10 days. - pramoxine-hydrocortisone (PROCTOCREAM-HC) 1-1 % rectal cream; Place 1 application rectally 2 (two) times daily.  Dispense: 30 g; Refill: [place], [place]  [facility]

## 2020-06-25 LAB — HM PAP SMEAR: HM Pap smear: NEGATIVE

## 2020-06-25 LAB — RESULTS CONSOLE HPV: CHL HPV: NEGATIVE

## 2020-07-26 ENCOUNTER — Ambulatory Visit (INDEPENDENT_AMBULATORY_CARE_PROVIDER_SITE_OTHER): Payer: BC Managed Care – PPO | Admitting: Family Medicine

## 2020-07-26 ENCOUNTER — Other Ambulatory Visit: Payer: Self-pay

## 2020-07-26 ENCOUNTER — Encounter: Payer: Self-pay | Admitting: Family Medicine

## 2020-07-26 VITALS — BP 114/83 | HR 102 | Temp 98.2°F | Wt 135.0 lb

## 2020-07-26 DIAGNOSIS — Z0000 Encounter for general adult medical examination without abnormal findings: Secondary | ICD-10-CM

## 2020-07-26 DIAGNOSIS — K602 Anal fissure, unspecified: Secondary | ICD-10-CM

## 2020-07-26 DIAGNOSIS — Z114 Encounter for screening for human immunodeficiency virus [HIV]: Secondary | ICD-10-CM | POA: Diagnosis not present

## 2020-07-26 DIAGNOSIS — Z1159 Encounter for screening for other viral diseases: Secondary | ICD-10-CM | POA: Diagnosis not present

## 2020-07-26 NOTE — Progress Notes (Signed)
Complete physical exam   Patient: Alexandra [name]   DOB: [birth year]   49 y.o. Female  MRN: [id number] Visit Date: 07/26/2020  Today's healthcare provider: Vernie Murders, PA   No chief complaint on file.  Subjective    Alexandra [name] is a 49 y.o. female who presents today for a complete physical exam.  She reports consuming a general diet.She generally feels well. She reports sleeping well. She does not have additional problems to discuss today.    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/29/2015   Personal history of ongoing treatment with alendronate (Fosamax) 03/29/2015   Micropuncture 03/29/2015   Cyst of ovary 03/29/2015   Family History  Problem Relation Age of Onset   Hyperlipidemia Brother    Heart attack Mother    Hashimoto's thyroiditis Mother    Dementia Mother    Hypertension Mother    Hyperlipidemia Mother    Migraines Mother    Neurologic Disorder Father    Cancer Paternal Grandmother    Kidney cancer Son    Heart disease Maternal Grandfather    Cancer Paternal Grandfather    Social History   Socioeconomic History   Marital status: Married    Spouse name: Not on file   Number of children: Not on file   Years of education: Not on file   Highest education level: Not on file  Occupational History   Not on file  Tobacco Use   Smoking status: Never Smoker   Smokeless tobacco: Never Used  Substance and Sexual Activity   Alcohol use: No    Alcohol/week: 0.0 standard drinks   Drug use: No   Sexual activity: Not on file  Other Topics Concern   Not on file  Social History Narrative   Not on file   Social Determinants of Health   Financial Resource  Strain:    Difficulty of Paying Living Expenses: Not on file  Food Insecurity:    Worried About Charity fundraiser in the Last Year: Not on file   [employer] of Food in the Last Year: Not on file  Transportation Needs:    Lack of Transportation (Medical): Not on file   Lack of Transportation (Non-Medical): Not on file  Physical Activity:    Days of Exercise per Week: Not on file   Minutes of Exercise per Session: Not on file  Stress:    Feeling of Stress : Not on file  Social Connections:    Frequency of Communication with Friends and Family: Not on file   Frequency of Social Gatherings with Friends and Family: Not on file   Attends Religious Services: Not on file   Active Member of Clubs or Organizations: Not on file   Attends Archivist Meetings: Not on file   Marital Status: Not on file  Intimate Partner Violence:    Fear of Current or Ex-Partner: Not on file   Emotionally Abused: Not on file   Physically Abused: Not on file   Sexually Abused: Not on file   Family Status  Relation Name Status   Brother  Alive   Mother  Alive   Father  Deceased  PGM  (Not Specified)   Son  Alive   MGF  (Not Specified)   PGF  (Not Specified)    No Known Allergies   Patient Care Team: Jarreau Callanan, Vickki Muff, PA as PCP - General (Physician Assistant)   Medications: Outpatient Medications Prior to Visit  Medication Sig   folic acid (FOLVITE) 1 MG tablet Take 1 mg by mouth daily.   Multiple Vitamin (MULTIVITAMIN PO) Take by mouth.   pramoxine-hydrocortisone (PROCTOCREAM-HC) 1-1 % rectal cream Place 1 application rectally 2 (two) times daily.   No facility-administered medications prior to visit.    Review of Systems  Constitutional: Negative.   HENT: Negative.   Eyes: Negative.   Respiratory: Negative.   Cardiovascular: Negative.   Gastrointestinal: Positive for blood in stool.  Endocrine: Negative.   Genitourinary: Negative.   Musculoskeletal: Negative.   Skin:  Negative.   Allergic/Immunologic: Negative.   Neurological: Negative.   Hematological: Negative.   Psychiatric/Behavioral: Negative.       Objective    BP 114/83 (BP Location: Right Arm, Patient Position: Sitting, Cuff Size: Normal)   Pulse (!) 102   Temp 98.2 F (36.8 C) (Oral)   Wt 135 lb (61.2 kg)   SpO2 100%   BMI 22.47 kg/m    Physical Exam Constitutional:      Appearance: Normal appearance. She is normal weight.  HENT:     Head: Normocephalic and atraumatic.     Right Ear: Tympanic membrane, ear canal and external ear normal.     Left Ear: Tympanic membrane, ear canal and external ear normal.     Nose: Nose normal.     Mouth/Throat:     Mouth: Mucous membranes are moist.     Pharynx: Oropharynx is clear.  Eyes:     Extraocular Movements: Extraocular movements intact.     Conjunctiva/sclera: Conjunctivae normal.     Pupils: Pupils are equal, round, and reactive to light.  Cardiovascular:     Rate and Rhythm: Normal rate and regular rhythm.     Pulses: Normal pulses.     Heart sounds: Normal heart sounds.  Pulmonary:     Effort: Pulmonary effort is normal.     Breath sounds: Normal breath sounds.  Abdominal:     General: Abdomen is flat. Bowel sounds are normal.     Palpations: Abdomen is soft.  Musculoskeletal:        General: Normal range of motion.     Cervical back: Normal range of motion and neck supple.  Skin:    General: Skin is warm and dry.  Neurological:     General: No focal deficit present.     Mental Status: She is alert and oriented to person, place, and time. Mental status is at baseline.  Psychiatric:        Mood and Affect: Mood normal.        Behavior: Behavior normal.        Thought Content: Thought content normal.        Judgment: Judgment normal.      Last depression screening scores PHQ 2/9 Scores 07/26/2020 01/06/2020  PHQ - 2 Score 0 0  PHQ- 9 Score - 0   Last fall risk screening Fall Risk  07/26/2020  Falls in the past  year? 0   Last Audit-C alcohol use screening Alcohol Use Disorder Test (AUDIT) 07/26/2020  1. How often do you have a drink containing alcohol? 0  2. How many drinks containing alcohol do you have  on a typical day when you are drinking? -  3. How often do you have six or more drinks on one occasion? -  AUDIT-C Score -  Alcohol Brief Interventions/Follow-up AUDIT Score <7 follow-up not indicated   A score of 3 or more in women, and 4 or more in men indicates increased risk for alcohol abuse, EXCEPT if all of the points are from question 1   No results found for any visits on 07/26/20.  Assessment & Plan    Routine Health Maintenance and Physical Exam  Exercise Activities and Dietary recommendations Goals   Recommend regular exercise 3-4 days a week for 30-40 minutes each session and increase water intake.     Immunization History  Administered Date(s) Administered   Influenza-Unspecified 08/11/2017, 07/09/2018   Tdap 10/18/2010    Health Maintenance  Topic Date Due   Hepatitis C Screening  Never done   COVID-19 Vaccine (1) Never done   HIV Screening  Never done   PAP SMEAR-Modifier  Never done   INFLUENZA VACCINE  04/29/2020   TETANUS/TDAP  10/18/2020    Discussed health benefits of physical activity, and encouraged her to engage in regular exercise appropriate for her age and condition.  1. Annual physical exam Good general health. Had mammogram and PAP in June. Check routine labs. - CBC with Differential/Platelet - Comprehensive metabolic panel - Lipid panel - TSH  2. 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 Had some return of blood in stools with itching sensation in July. Proctocream-HC has helped to control symptoms and stop the bleeding. Recommend she use it for a week continuously BID. Recheck labs for signs of anemia. May need referral to surgeon if not healing. Given anal fissure handout for suggestions for control and healing. - CBC with  Differential/Platelet  3. Screening for HIV (human immunodeficiency virus) - HIV Antibody (routine testing w rflx)  4. Need for hepatitis C screening test - Hepatitis C antibody   No follow-ups on file.     I, Mariateresa Batra, PA-C, have reviewed all documentation for this visit. The documentation on 06/18/21 for the exam, diagnosis, procedures, and orders are all accurate and complete.    Vernie Murders, [place] [phone] (phone) [phone] (fax)  [place]

## 2020-07-26 NOTE — Patient Instructions (Signed)
Anal Fissure, Adult  An anal fissure is a small tear or crack in the tissue of the anus. Bleeding from a fissure usually stops on its own within a few minutes. However, bleeding will often occur again with each bowel movement until the fissure heals. What are the causes? This condition is usually caused by passing a large or hard stool (feces). Other causes include:  Constipation.  Frequent diarrhea.  Inflammatory bowel disease (Crohn's disease or ulcerative colitis).  Childbirth.  Infections.  Anal sex. What are the signs or symptoms? Symptoms of this condition include:  Bleeding from the rectum.  Small amounts of blood seen on your stool, on the toilet paper, or in the toilet after a bowel movement. The blood coats the outside of the stool and is not mixed with the stool.  Painful bowel movements.  Itching or irritation around the anus. How is this diagnosed? A health care provider may diagnose this condition by closely examining the anal area. An anal fissure can usually be seen with careful inspection. In some cases, a rectal exam may be performed, or a short tube (anoscope) may be used to examine the anal canal. How is this treated? Initial treatment for this condition may include:  Taking steps to avoid constipation. This may include making changes to your diet, such as increasing your intake of fiber or fluid.  Taking fiber supplements. These supplements can soften your stool to help make bowel movements easier. Your health care provider may also prescribe a stool softener if your stool is hard.  Taking sitz baths. This may help to heal the tear.  Using medicated creams or ointments. These may be prescribed to lessen discomfort. Treatments that are sometimes used if initial treatments do not work well or if the condition is more severe may include:  Botulinum injection.  Surgery to repair the fissure. Follow these instructions at home: Eating and  drinking   Avoid foods that may cause constipation, such as bananas, milk, and other dairy products.  Eat all fruits, except bananas.  Drink enough fluid to keep your urine pale yellow.  Eat foods that are high in fiber, such as beans, whole grains, and fresh fruits and vegetables. General instructions   Take over-the-counter and prescription medicines only as told by your health care provider.  Use creams or ointments only as told by your health care provider.  Keep the anal area clean and dry.  Take sitz baths as told by your health care provider. Do not use soap in the sitz baths.  Keep all follow-up visits as told by your health care provider. This is important. Contact a health care provider if you have:  More bleeding.  A fever.  Diarrhea that is mixed with blood.  Pain that continues.  Ongoing problems that are getting worse rather than better. Summary  An anal fissure is a small tear or crack in the tissue of the anus. This condition is usually caused by passing a large or hard stool (feces). Other causes include constipation and frequent diarrhea.  Initial treatment for this condition may include taking steps to avoid constipation, such as increasing your intake of fiber or fluid.  Follow instructions for care as told by your health care provider.  Contact your health care provider if you have more bleeding or your problem is getting worse rather than better.  Keep all follow-up visits as told by your health care provider. This is important. This information is not intended to replace advice given  to you by your health care provider. Make sure you discuss any questions you have with your health care provider. Document Revised: 02/25/2018 Document Reviewed: 02/25/2018 Elsevier Patient Education  [place].

## 2020-10-30 LAB — CBC WITH DIFFERENTIAL/PLATELET
Basophils Absolute: 0.1 10*3/uL (ref 0.0–0.2)
Basos: 1 %
EOS (ABSOLUTE): 0.1 10*3/uL (ref 0.0–0.4)
Eos: 1 %
Hematocrit: 40.6 % (ref 34.0–46.6)
Hemoglobin: 13.8 g/dL (ref 11.1–15.9)
Immature Grans (Abs): 0 10*3/uL (ref 0.0–0.1)
Immature Granulocytes: 0 %
Lymphocytes Absolute: 2.1 10*3/uL (ref 0.7–3.1)
Lymphs: 26 %
MCH: 29.4 pg (ref 26.6–33.0)
MCHC: 34 g/dL (ref 31.5–35.7)
MCV: 86 fL (ref 79–97)
Monocytes Absolute: 0.7 10*3/uL (ref 0.1–0.9)
Monocytes: 9 %
Neutrophils Absolute: 5.1 10*3/uL (ref 1.4–7.0)
Neutrophils: 63 %
Platelets: 338 10*3/uL (ref 150–450)
RBC: 4.7 x10E6/uL (ref 3.77–5.28)
RDW: 12 % (ref 11.7–15.4)
WBC: 8 10*3/uL (ref 3.4–10.8)

## 2020-10-30 LAB — LIPID PANEL
Chol/HDL Ratio: 3.1 ratio (ref 0.0–4.4)
Cholesterol, Total: 197 mg/dL (ref 100–199)
HDL: 64 mg/dL (ref >39)
LDL Chol Calc (NIH): 123 mg/dL — ABNORMAL HIGH (ref 0–99)
Triglycerides: 53 mg/dL (ref 0–149)
VLDL Cholesterol Cal: 10 mg/dL (ref 5–40)

## 2020-10-30 LAB — COMPREHENSIVE METABOLIC PANEL
ALT: 13 IU/L (ref 0–32)
AST: 13 IU/L (ref 0–40)
Albumin/Globulin Ratio: 1.9 (ref 1.2–2.2)
Albumin: 5 g/dL — ABNORMAL HIGH (ref 3.8–4.8)
Alkaline Phosphatase: 79 IU/L (ref 44–121)
BUN/Creatinine Ratio: 15 (ref 9–23)
BUN: 11 mg/dL (ref 6–24)
Bilirubin Total: 0.4 mg/dL (ref 0.0–1.2)
CO2: 20 mmol/L (ref 20–29)
Calcium: 9.4 mg/dL (ref 8.7–10.2)
Chloride: 105 mmol/L (ref 96–106)
Creatinine, Ser: 0.71 mg/dL (ref 0.57–1.00)
GFR calc Af Amer: 116 mL/min/{1.73_m2} (ref >59)
GFR calc non Af Amer: 101 mL/min/{1.73_m2} (ref >59)
Globulin, Total: 2.7 g/dL (ref 1.5–4.5)
Glucose: 107 mg/dL — ABNORMAL HIGH (ref 65–99)
Potassium: 4 mmol/L (ref 3.5–5.2)
Sodium: 140 mmol/L (ref 134–144)
Total Protein: 7.7 g/dL (ref 6.0–8.5)

## 2020-10-30 LAB — HEPATITIS C ANTIBODY: Hep C Virus Ab: <0.1 s/co ratio (ref 0.0–0.9)

## 2020-10-30 LAB — HIV ANTIBODY (ROUTINE TESTING W REFLEX): HIV Screen 4th Generation wRfx: NONREACTIVE

## 2020-10-30 LAB — TSH: TSH: 4.19 u[IU]/mL (ref 0.450–4.500)

## 2020-11-30 ENCOUNTER — Telehealth: Payer: Self-pay | Admitting: *Deleted

## 2020-11-30 NOTE — Telephone Encounter (Signed)
Patient returned call and notified : Blood tests normal except LDL above goal of <100. Recommend low fat diet and exercising 30-40 minutes 3-4 times a week. Recheck labs to assess progress in 4 months. Patient states she is exercising- so must be diet- will try to watch that to bring that number down.

## 2020-12-05 ENCOUNTER — Telehealth: Payer: Self-pay

## 2020-12-05 NOTE — Telephone Encounter (Signed)
-----   Message from [place] sent at 11/26/2020  9:34 AM EST ----- Phone busy, if patient calls in [facility] triage may give results

## 2020-12-05 NOTE — Telephone Encounter (Signed)
Pt called and verbalized understanding of information below.

## 2021-01-01 LAB — COLOGUARD: Cologuard: NEGATIVE

## 2021-03-25 LAB — HM MAMMOGRAPHY

## 2021-10-31 ENCOUNTER — Ambulatory Visit: Payer: BC Managed Care – PPO | Admitting: Physician Assistant

## 2021-11-07 ENCOUNTER — Ambulatory Visit: Payer: Self-pay | Admitting: Physician Assistant

## 2021-11-26 NOTE — Progress Notes (Signed)
I,[name],acting as a Education administrator for [employer], PA-C.,have documented all relevant documentation on the behalf of [name], PA-C,as directed by  [name], PA-C while in the presence of [name], PA-C.   Complete physical exam   Patient: Alexandra [name]   DOB: 1971-07-17   51 y.o. Female  MRN: [id number] Visit Date: 11/27/2021  Today's healthcare provider: [name], PA-C   Cc. cpe  Subjective    Alexandra [name] is a 51 y.o. female who presents today for a complete physical exam.  She reports consuming a general diet.  The patient reports going walking 5 days a week for 30 minutes with mild intensity.  She generally feels well. She reports sleeping well. She does not have additional problems to discuss today.  HPI     Past Medical History:  Diagnosis Date   Anemia    Blood transfusion without reported diagnosis    Past Surgical History:  Procedure Laterality Date   BREAST ENHANCEMENT SURGERY  10/1999   BREAST SURGERY     CESAREAN SECTION  2009   TWINS   COSMETIC SURGERY     DILATION AND CURETTAGE OF UTERUS  02/03/2005   FRACTURE SURGERY     LEEP  2012   FOR CERVICAL DYSPLASIA   TUBAL LIGATION  2009   Social History   Socioeconomic History   Marital status: Married    Spouse name: Not on file   Number of children: Not on file   Years of education: Not on file   Highest education level: Not on file  Occupational History   Not on file  Tobacco Use   Smoking status: Never   Smokeless tobacco: Never  Substance and Sexual Activity   Alcohol use: No    Alcohol/week: 0.0 standard drinks   Drug use: No   Sexual activity: Not on file  Other Topics Concern   Not on file  Social History Narrative   Not on file   Social Determinants of Health   Financial Resource Strain: Not on file  Food Insecurity: Not on file  Transportation Needs: Not on file  Physical Activity: Not on file  Stress: Not on file  Social Connections: Not  on file  Intimate Partner Violence: Not on file   Family Status  Relation Name Status   Brother  Alive   Mother  Alive   Father  Deceased   PGM  (Not Specified)   Son  Alive   MGF  (Not Specified)   PGF  (Not Specified)   Family History  Problem Relation Age of Onset   Hyperlipidemia Brother    Heart attack Mother    Hashimoto's thyroiditis Mother    Dementia Mother    Hypertension Mother    Hyperlipidemia Mother    Migraines Mother    Neurologic Disorder Father    Cancer Paternal Grandmother    Kidney cancer Son    Heart disease Maternal Grandfather    Cancer Paternal Grandfather    No Known Allergies  Patient Care Team: [name], PA-C as PCP - General (Physician Assistant) Laurey Morale, Wonda Cheng, MD (Obstetrics and Gynecology) Aloha Gell, MD as Consulting Physician (Obstetrics and Gynecology)   Medications: Outpatient Medications Prior to Visit  Medication Sig   FIBER ADULT GUMMIES PO Take by mouth.   folic acid (FOLVITE) 1 MG tablet Take 1 mg by mouth daily.   norethindrone (MICRONOR) 0.35 MG tablet Take 1 tablet by mouth daily.   [DISCONTINUED] Multiple  Vitamin (MULTIVITAMIN PO) Take by mouth. (Patient not taking: Reported on 11/27/2021)   [DISCONTINUED] pramoxine-hydrocortisone (PROCTOCREAM-HC) 1-1 % rectal cream Place 1 application rectally 2 (two) times daily. (Patient not taking: Reported on 11/27/2021)   No facility-administered medications prior to visit.    Review of Systems  Constitutional: Negative.  Negative for fatigue and fever.  Eyes: Negative.   Respiratory:  Negative for cough and shortness of breath.   Cardiovascular: Negative.  Negative for chest pain and leg swelling.  Gastrointestinal: Negative.  Negative for abdominal pain.  Endocrine: Negative.   Genitourinary: Negative.   Musculoskeletal: Negative.   Skin: Negative.   Allergic/Immunologic: Negative.   Neurological: Negative.  Negative for dizziness and headaches.  Hematological:  Negative.   Psychiatric/Behavioral: Negative.      Objective    Blood pressure 134/87, pulse 92, height 5' 5"  (1.651 m), weight 130 lb 9.6 oz (59.2 kg), last menstrual period 07/31/2019, SpO2 100 %.    Physical Exam Constitutional:      General: She is awake.     Appearance: She is well-developed.  HENT:     Head: Normocephalic.     Right Ear: Tympanic membrane normal.     Left Ear: Tympanic membrane normal.  Eyes:     Conjunctiva/sclera: Conjunctivae normal.     Pupils: Pupils are equal, round, and reactive to light.  Neck:     Thyroid: Thyroid mass present. No thyromegaly.     Comments: Right thyroid more prominent than left, firm,  Cardiovascular:     Rate and Rhythm: Normal rate and regular rhythm.     Heart sounds: Normal heart sounds.  Pulmonary:     Effort: Pulmonary effort is normal.     Breath sounds: Normal breath sounds.  Abdominal:     Palpations: Abdomen is soft.     Tenderness: There is no abdominal tenderness.  Musculoskeletal:     Right lower leg: No swelling.     Left lower leg: No swelling.  Lymphadenopathy:     Cervical: No cervical adenopathy.  Skin:    General: Skin is warm.  Neurological:     Mental Status: She is alert and oriented to person, place, and time.  Psychiatric:        Attention and Perception: Attention normal.        Mood and Affect: Mood normal.        Speech: Speech normal.        Behavior: Behavior is cooperative.     Last depression screening scores PHQ 2/9 Scores 11/27/2021 07/26/2020 01/06/2020  PHQ - 2 Score 0 0 0  PHQ- 9 Score 0 - 0   Last fall risk screening Fall Risk  11/27/2021  Falls in the past year? 0  Number falls in past yr: 0  Injury with Fall? 0  Risk for fall due to : No Fall Risks   Last Audit-C alcohol use screening Alcohol Use Disorder Test (AUDIT) 11/27/2021  1. How often do you have a drink containing alcohol? 0  2. How many drinks containing alcohol do you have on a typical day when you are drinking? 0   3. How often do you have six or more drinks on one occasion? 0  AUDIT-C Score 0  Alcohol Brief Interventions/Follow-up -   A score of 3 or more in women, and 4 or more in men indicates increased risk for alcohol abuse, EXCEPT if all of the points are from question 1   No results found for any  visits on 11/27/21.  Assessment & Plan    Routine Health Maintenance and Physical Exam  Exercise Activities and Dietary recommendations -- regular exercise/physical activity 3-5 times a week --balanced diet low in sugars, fats, carbs, high in fiber and protein   Immunization History  Administered Date(s) Administered   Influenza-Unspecified 08/11/2017, 07/09/2018   Tdap 10/18/2010    Health Maintenance  Topic Date Due   PAP SMEAR-Modifier  Never done   COLONOSCOPY (Pts 45-52yr Insurance coverage will need to be confirmed)  Never done   TETANUS/TDAP  10/18/2020   COVID-19 Vaccine (1) 12/13/2021 (Originally 06/12/1971)   INFLUENZA VACCINE  12/27/2021 (Originally 04/29/2021)   Hepatitis C Screening  Completed   HIV Screening  Completed   HPV VACCINES  Aged Out    Discussed health benefits of physical activity, and encouraged her to engage in regular exercise appropriate for her age and condition.  Problem List Items Addressed This Visit       Endocrine   Palpable abnormality of thyroid gland    Right side of thyroid feels more prominent and firm, cannot r/o mass Tsh/t4 [place]athyroid      Relevant Orders   TSH + free T4   [place]RO[place]     Other   Elevated blood pressure reading in office with white coat syndrome, without diagnosis of hypertension    Slightly elevated BP in office; likely nerves, will monitor Advised pt to check a few times at home/with school nurse Nurse visit for bp check 6-8 weeks      Moderate mixed hyperlipidemia not requiring statin therapy    Mildly elevated LDL, will recheck lipid panel      Relevant Orders   Lipid panel   Comprehensive metabolic  panel   Other Visit Diagnoses     Annual physical exam    -  Primary   Relevant Orders   Lipid panel   Comprehensive metabolic panel      We will reach out to  gyn for cologuard results -- pt reported March 2022, negative; for mammogram results--pt reported 6/22, negative.  Discussed tetanus vaccine and upcoming shingles vaccine.   Return in about 6 weeks (around 01/08/2022) for hypertension--nurse visit.     I, LMikey Kirschner PA-C have reviewed all documentation for this visit. The documentation on  11/27/2021  for the exam, diagnosis, procedures, and orders are all accurate and complete.  LMikey Kirschner PA-C [facility] [place] [place] [address]Office: [phone]: 3[facility] Home

## 2021-11-27 ENCOUNTER — Encounter: Payer: Self-pay | Admitting: Physician Assistant

## 2021-11-27 ENCOUNTER — Other Ambulatory Visit: Payer: Self-pay

## 2021-11-27 ENCOUNTER — Ambulatory Visit (INDEPENDENT_AMBULATORY_CARE_PROVIDER_SITE_OTHER): Payer: BC Managed Care – PPO | Admitting: Physician Assistant

## 2021-11-27 VITALS — BP 134/87 | HR 92 | Ht 65.0 in | Wt 130.6 lb

## 2021-11-27 DIAGNOSIS — R030 Elevated blood-pressure reading, without diagnosis of hypertension: Secondary | ICD-10-CM

## 2021-11-27 DIAGNOSIS — E079 Disorder of thyroid, unspecified: Secondary | ICD-10-CM | POA: Diagnosis not present

## 2021-11-27 DIAGNOSIS — Z0000 Encounter for general adult medical examination without abnormal findings: Secondary | ICD-10-CM | POA: Diagnosis not present

## 2021-11-27 DIAGNOSIS — E782 Mixed hyperlipidemia: Secondary | ICD-10-CM

## 2021-11-27 NOTE — Assessment & Plan Note (Signed)
Slightly elevated BP in office; likely nerves, will monitor ?Advised pt to check a few times at home/with school nurse ?Nurse visit for bp check 6-8 weeks ?

## 2021-11-27 NOTE — Assessment & Plan Note (Signed)
Right side of thyroid feels more prominent and firm, cannot r/o mass ?Tsh/t4 ?US thyroid ?

## 2021-11-27 NOTE — Assessment & Plan Note (Signed)
Mildly elevated LDL, will recheck lipid panel ?

## 2021-11-28 ENCOUNTER — Telehealth: Payer: Self-pay

## 2021-11-28 LAB — TSH+FREE T4
Free T4: 1.13 ng/dL (ref 0.82–1.77)
TSH: 1.75 u[IU]/mL (ref 0.450–4.500)

## 2021-11-28 NOTE — Telephone Encounter (Signed)
Pt given lab results per notes of Ria Comment, [place] on 11/28/21. Pt verbalized understanding. Pt asked about [place] referral. Advised her referral was in progress and someone would be calling her to schedule an appt.  ? ?

## 2021-12-04 ENCOUNTER — Other Ambulatory Visit: Payer: Self-pay

## 2021-12-04 ENCOUNTER — Ambulatory Visit
Admission: RE | Admit: 2021-12-04 | Discharge: 2021-12-04 | Disposition: A | Discharge status: Home / Self Care | Payer: BC Managed Care – PPO | Source: Ambulatory Visit | Attending: Physician Assistant | Admitting: Physician Assistant

## 2021-12-04 DIAGNOSIS — E079 Disorder of thyroid, unspecified: Secondary | ICD-10-CM | POA: Insufficient documentation

## 2021-12-05 ENCOUNTER — Encounter: Payer: Self-pay | Admitting: Physician Assistant

## 2021-12-05 ENCOUNTER — Other Ambulatory Visit: Payer: Self-pay | Admitting: Physician Assistant

## 2021-12-05 DIAGNOSIS — R9389 Abnormal findings on diagnostic imaging of other specified body structures: Secondary | ICD-10-CM

## 2021-12-05 DIAGNOSIS — E041 Nontoxic single thyroid nodule: Secondary | ICD-10-CM

## 2021-12-10 ENCOUNTER — Ambulatory Visit: Payer: BC Managed Care – PPO

## 2021-12-10 ENCOUNTER — Other Ambulatory Visit: Payer: Self-pay | Admitting: Endocrinology

## 2021-12-10 DIAGNOSIS — E041 Nontoxic single thyroid nodule: Secondary | ICD-10-CM

## 2021-12-11 ENCOUNTER — Telehealth: Payer: Self-pay | Admitting: *Deleted

## 2021-12-11 NOTE — Telephone Encounter (Signed)
Asked by scheduler to call patient as she left a message with scheduler that she was expecting a call from a nurse regarding the procedure scheduled for tomorrow, 12/12/2021.  Made several attempts to call patient and could not leave message as stated messages full.  Called husband and he stated wife on her phone and call back in 5-10 minutes.  Attempted several callbacks and went straight to voice message.  Patient then called back and she stated she "really didn't have any questions".  I confirmed with her time to be at [place] 1:30pm and she verbalized arrival time.  Recommended patient to not wear jewelry around neck and to wear comfortable clothing. Patient stated she had no questions. ?

## 2021-12-12 ENCOUNTER — Ambulatory Visit
Admission: RE | Admit: 2021-12-12 | Discharge: 2021-12-12 | Disposition: A | Discharge status: Home / Self Care | Payer: BC Managed Care – PPO | Source: Ambulatory Visit | Attending: Endocrinology | Admitting: Endocrinology

## 2021-12-12 ENCOUNTER — Other Ambulatory Visit: Payer: Self-pay

## 2021-12-12 DIAGNOSIS — C73 Malignant neoplasm of thyroid gland: Secondary | ICD-10-CM | POA: Insufficient documentation

## 2021-12-12 DIAGNOSIS — E041 Nontoxic single thyroid nodule: Secondary | ICD-10-CM

## 2021-12-12 NOTE — Procedures (Signed)
Successful US guided FNA of isthmus thyroid nodule ?No complications. ?See PACS for full report. ? ?Tsosie Billing D, PA-C ?12/12/2021, 2:51 PM ? ? ? ? ?

## 2021-12-13 ENCOUNTER — Other Ambulatory Visit: Payer: Self-pay | Admitting: Physician Assistant

## 2021-12-13 ENCOUNTER — Telehealth: Payer: Self-pay

## 2021-12-13 DIAGNOSIS — C73 Malignant neoplasm of thyroid gland: Secondary | ICD-10-CM

## 2021-12-13 LAB — CYTOLOGY - NON PAP

## 2021-12-13 NOTE — Telephone Encounter (Signed)
Pt returned Ria Comment Drubel's call. ?Please return at to pt at [phone] ?

## 2021-12-13 NOTE — Progress Notes (Signed)
Called patient regarding new dx of papillary thyroid cancer. ?Ref to oncology . ? ?

## 2021-12-13 NOTE — Telephone Encounter (Signed)
Pt returned Ria Comment Drubel's call. ?Please return at to pt at [phone] ?

## 2021-12-16 ENCOUNTER — Encounter: Payer: Self-pay | Admitting: Physician Assistant

## 2021-12-17 ENCOUNTER — Encounter: Payer: Self-pay | Admitting: Oncology

## 2021-12-18 ENCOUNTER — Inpatient Hospital Stay: Payer: BC Managed Care – PPO

## 2021-12-18 ENCOUNTER — Other Ambulatory Visit: Payer: Self-pay

## 2021-12-18 ENCOUNTER — Encounter: Payer: Self-pay | Admitting: Oncology

## 2021-12-18 ENCOUNTER — Inpatient Hospital Stay: Payer: BC Managed Care – PPO | Attending: Oncology | Admitting: Oncology

## 2021-12-18 VITALS — BP 160/87 | HR 116 | Temp 97.2°F | Resp 16 | Ht 65.0 in | Wt 131.0 lb

## 2021-12-18 DIAGNOSIS — C73 Malignant neoplasm of thyroid gland: Secondary | ICD-10-CM | POA: Insufficient documentation

## 2021-12-18 DIAGNOSIS — Z8051 Family history of malignant neoplasm of kidney: Secondary | ICD-10-CM | POA: Diagnosis not present

## 2021-12-18 DIAGNOSIS — E079 Disorder of thyroid, unspecified: Secondary | ICD-10-CM

## 2021-12-18 NOTE — Research (Signed)
Trial:  [employer] 2021-05 ?Patient Alexandra [name] was identified by Jeral Fruit, RN as a potential candidate for the above listed study.  This Clinical Research Nurse met with Alexandra [name], [id number], on 12/18/21 in a manner and location that ensures patient privacy to discuss participation in the above listed research study.  Patient is Accompanied by her spouse .  A copy of the informed consent document and separate HIPAA Authorization was provided to the patient.  Patient reads, speaks, and understands [place].   ?Patient was provided with the business card of this Nurse and encouraged to contact the research team with any questions.  Approximately 15 minutes were spent with the patient reviewing the informed consent documents.  Patient was provided the option of taking informed consent documents home to review and was encouraged to review at their convenience with their support network, including other care providers. Patient took the consent documents home to review. ?Patient states she wants to participate in the study. She is agreeable to coming in for her consent and labs on the day of her next visit here at the hospital for her CT Scan. Research nurse will call patient to arrange time for the consent visit when her CT is scheduled, this will also give her more time to review the protocol with her husband.  ?Jeral Fruit, RN ?2:32 PM ?12/18/21 ?  ?

## 2021-12-19 ENCOUNTER — Telehealth: Payer: Self-pay

## 2021-12-19 DIAGNOSIS — E079 Disorder of thyroid, unspecified: Secondary | ICD-10-CM

## 2021-12-19 NOTE — Telephone Encounter (Signed)
Research nurse called patient to coordinate her lab visit schedule with her CT scan on 01/01/22. The patient states she will just walk over to the [facility] when she completes her CT scan scheduled for 130 pm that day. She has reviewed the protocol and does want to participate in the study. Request for lab appointment to be scheduled sent to the scheduling in basket.  ?Jeral Fruit, RN ?12/19/21 ?2:29 PM ? ? ?

## 2021-12-20 NOTE — Progress Notes (Signed)
?[place]  ?[phone] [phone] ? ?ID: [name] OB: 1971-05-23  MR#: [id number]  JYN#:[id number] ? ?Patient Care Team: ?Emelia Loron as PCP - General (Physician Assistant) ?Rosina Lowenstein, MD (Obstetrics and Gynecology) ?Aloha Gell, MD as Consulting Physician (Obstetrics and Gynecology) ? 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 ? ?INTERVAL HISTORY: Patient is a 51 year old female who was noted to have a thyroid nodule on routine annual physical.  Subsequent ultrasound and FNA revealed papillary carcinoma.  Patient is anxious, but otherwise feels well.  She has no neurologic complaints.  She denies any recent fevers or illnesses.  She has a good appetite and denies weight loss.  She has no chest pain, shortness of breath, cough, or hemoptysis.  She denies any nausea, vomiting, constipation, or diarrhea.  She has no urinary complaints.  Patient feels at her baseline and offers no further specific complaints today. ? ?REVIEW OF SYSTEMS:   ?Review of Systems  ?Constitutional: Negative.  Negative for fever, malaise/fatigue and weight loss.  ?Respiratory: Negative.  Negative for cough, hemoptysis and shortness of breath.   ?Cardiovascular: Negative.  Negative for chest pain and leg swelling.  ?Gastrointestinal: Negative.  Negative for abdominal pain.  ?Genitourinary: Negative.  Negative for dysuria.  ?Musculoskeletal: Negative.  Negative for back pain.  ?Skin: Negative.  Negative for rash.  ?Neurological: Negative.  Negative for dizziness, focal weakness, weakness and headaches.  ?Psychiatric/Behavioral:  The patient is nervous/anxious.   ? ?As per HPI. Otherwise, a complete review of systems is negative. ? ?PAST MEDICAL HISTORY: ?Past Medical History:  ?Diagnosis Date  ? Anemia   ? Blood transfusion without reported diagnosis   ? ? ?PAST SURGICAL HISTORY: ?Past Surgical History:  ?Procedure Laterality Date  ? BREAST ENHANCEMENT SURGERY   10/1999  ? BREAST SURGERY    ? CESAREAN SECTION  2009  ? TWINS  ? COSMETIC SURGERY    ? DILATION AND CURETTAGE OF UTERUS  02/03/2005  ? FRACTURE SURGERY    ? LEEP  2012  ? FOR CERVICAL DYSPLASIA  ? TUBAL LIGATION  2009  ? ? ?FAMILY HISTORY: ?Family History  ?Problem Relation Age of Onset  ? Hashimoto's thyroiditis Mother   ? Dementia Mother   ? Hypertension Mother   ? Hyperlipidemia Mother   ? Migraines Mother   ? Stroke Mother   ? Neurologic Disorder Father   ? Hyperlipidemia Brother   ? Heart disease Maternal Grandfather   ? Cancer Paternal Grandmother   ? Cancer Paternal Grandfather   ? Kidney cancer Son   ? ? ?ADVANCED DIRECTIVES (Y/N):  N ? ?HEALTH MAINTENANCE: ?Social History  ? ?Tobacco Use  ? Smoking status: Never  ? Smokeless tobacco: Never  ?Substance Use Topics  ? Alcohol use: No  ?  Alcohol/week: 0.0 standard drinks  ? Drug use: No  ? ? ? Colonoscopy: ? PAP: ? Bone density: ? Lipid panel: ? ?No Known Allergies ? ?Current Outpatient Medications  ?Medication Sig Dispense Refill  ? FIBER ADULT GUMMIES PO Take by mouth.    ? folic acid (FOLVITE) 1 MG tablet Take 1 mg by mouth daily.    ? norethindrone (MICRONOR) 0.35 MG tablet Take 1 tablet by mouth daily.    ? ?No current facility-administered medications for this visit.  ? ? ?OBJECTIVE: ?Vitals:  ? 12/17/21 1452  ?BP: (!) 160/87  ?Pulse: (!) 116  ?Resp: 16  ?Temp: (!) 97.2 ?F (36.2 ?C)  ?SpO2: 99%  ?   Body  mass index is 21.8 kg/m?Marland Kitchen    ECOG FS:0 - Asymptomatic ? ?General: Well-developed, well-nourished, no acute distress. ?Eyes: Pink conjunctiva, anicteric sclera. ?HEENT: Normocephalic, moist mucous membranes.  Minimally palpable thyroid nodule. ?Lungs: No audible wheezing or coughing. ?Heart: Regular rate and rhythm. ?Abdomen: Soft, nontender, no obvious distention. ?Musculoskeletal: No edema, cyanosis, or clubbing. ?Neuro: Alert, answering all questions appropriately. Cranial nerves grossly intact. ?Skin: No rashes or petechiae noted. ?Psych: Normal  affect. ?Lymphatics: No cervical, calvicular, axillary or inguinal LAD. ? ? ?LAB RESULTS: ? ?Lab Results  ?Component Value Date  ? NA 140 10/29/2020  ? K 4.0 10/29/2020  ? CL 105 10/29/2020  ? CO2 20 10/29/2020  ? GLUCOSE 107 (H) 10/29/2020  ? BUN 11 10/29/2020  ? CREATININE 0.71 10/29/2020  ? CALCIUM 9.4 10/29/2020  ? PROT 7.7 10/29/2020  ? ALBUMIN 5.0 (H) 10/29/2020  ? AST 13 10/29/2020  ? ALT 13 10/29/2020  ? ALKPHOS 79 10/29/2020  ? BILITOT 0.4 10/29/2020  ? GFRNONAA 101 10/29/2020  ? GFRAA 116 10/29/2020  ? ? ?Lab Results  ?Component Value Date  ? WBC 8.0 10/29/2020  ? NEUTROABS 5.1 10/29/2020  ? HGB 13.8 10/29/2020  ? HCT 40.6 10/29/2020  ? MCV 86 10/29/2020  ? PLT 338 10/29/2020  ? ? ? ?STUDIES: ?[place] FNA BX THYROID 1ST LESION AFIRMA ? ?Result Date: 12/12/2021 ?INDICATION: Indeterminate thyroid nodule EXAM: ULTRASOUND GUIDED FINE NEEDLE ASPIRATION OF INDETERMINATE THYROID NODULE COMPARISON:  Ultrasound thyroid 12/05/2021 MEDICATIONS: Local 1% lidocaine only. COMPLICATIONS: None immediate. TECHNIQUE: Informed written consent was obtained from the patient after a discussion of the risks, benefits and alternatives to treatment. Questions regarding the procedure were encouraged and answered. A timeout was performed prior to the initiation of the procedure. Pre-procedural ultrasound scanning demonstrated unchanged size and appearance of the indeterminate nodule within the isthmus thyroid lobe. The procedure was planned. The neck was prepped in the usual sterile fashion, and a sterile drape was applied covering the operative field. A timeout was performed prior to the initiation of the procedure. Local anesthesia was provided with 1% lidocaine. Under direct ultrasound guidance, 5 FNA biopsies were performed of the isthmus nodule with a 25 gauge needle. Multiple ultrasound images were saved for procedural documentation purposes. The samples were prepared and submitted to pathology. Limited post procedural scanning was  negative for hematoma or additional complication. Dressings were placed. The patient tolerated the above procedures procedure well without immediate postprocedural complication. FINDINGS: FINDINGS Nodule reference number based on prior diagnostic ultrasound: 1 Maximum size: 2.0 cm Location: Isthmus ACR TI-RADS total points: 7 ACR TI-RADS risk category:  TR5 (>/= 7 points) Prior biopsy:  No Reason for biopsy: meets ACR TI-RADS criteria Ultrasound imaging confirms appropriate placement of the needles within the thyroid nodule. IMPRESSION: Successful ultrasound guided FNA biopsy of 2.0 cm isthmus TR-5 thyroid nodule. This exam was performed by Tsosie Billing PA-C, and was supervised and interpreted by Dr. Maryelizabeth Kaufmann. Electronically Signed   By: Michaelle Birks M.D.   On: 12/12/2021 16:06  ? ?US THYROID ? ?Result Date: 12/05/2021 ?CLINICAL DATA:  Palpable abnormality. EXAM: THYROID ULTRASOUND TECHNIQUE: Ultrasound examination of the thyroid gland and adjacent soft tissues was performed. COMPARISON:  None. FINDINGS: Parenchymal Echotexture: Mildly heterogenous Isthmus: 0.4 cm Right lobe: 3.7 x 1.5 x 1.4 cm Left lobe: 3.6 x 1.2 x 1.5 cm _________________________________________________________ Estimated total number of nodules >/= 1 cm: 1 Number of spongiform nodules >/=  2 cm not described below (TR1): 0 Number of mixed cystic and  solid nodules >/= 1.5 cm not described below ([place]): 0 _________________________________________________________ Nodule # 1: Location: Isthmus Maximum size: 2.0 cm; Other 2 dimensions: 1.9 x 1.1 cm Composition: solid/almost completely solid (2) Echogenicity: hypoechoic (2) Shape: not taller-than-wide (0) Margins: ill-defined (0) Echogenic foci: punctate echogenic foci (3) ACR TI-RADS total points: 7. ACR TI-RADS risk category: TR5 (>/= 7 points). ACR TI-RADS recommendations: **Given size (>/= 1.0 cm) and appearance, fine needle aspiration of this highly suspicious nodule should be considered based on  TI-RADS criteria. _________________________________________________________ No cervical adenopathy or abnormal fluid collection within the imaged neck. IMPRESSION: 2.0 cm isthmus TR-5 thyroid nodule. Fine nee

## 2021-12-23 ENCOUNTER — Telehealth: Payer: Self-pay | Admitting: *Deleted

## 2021-12-23 NOTE — Addendum Note (Signed)
Addended by: Wilford Corner on: 12/23/2021 03:30 PM ? ? Modules accepted: Orders ? ?

## 2021-12-23 NOTE — Telephone Encounter (Signed)
Patient called stating that she was to have been referred to ENT Dr Tami Ribas. She had not heard form them so she called and was told that the have not received a referral from [place] on this. I do see that a referral has been entered into the computer, but do not know if it was faxed to them. ?

## 2021-12-31 ENCOUNTER — Other Ambulatory Visit: Payer: Self-pay

## 2021-12-31 DIAGNOSIS — E079 Disorder of thyroid, unspecified: Secondary | ICD-10-CM

## 2022-01-01 ENCOUNTER — Ambulatory Visit
Admission: RE | Admit: 2022-01-01 | Discharge: 2022-01-01 | Disposition: A | Discharge status: Home / Self Care | Payer: BC Managed Care – PPO | Source: Ambulatory Visit | Attending: Oncology | Admitting: Oncology

## 2022-01-01 ENCOUNTER — Inpatient Hospital Stay: Payer: BC Managed Care – PPO

## 2022-01-01 ENCOUNTER — Inpatient Hospital Stay: Payer: BC Managed Care – PPO | Attending: Oncology | Admitting: Hospice and Palliative Medicine

## 2022-01-01 DIAGNOSIS — E079 Disorder of thyroid, unspecified: Secondary | ICD-10-CM | POA: Insufficient documentation

## 2022-01-01 MED ORDER — IOHEXOL 300 MG/ML  SOLN
75.0000 mL | Freq: Once | INTRAMUSCULAR | Status: AC | PRN
  Administered 2022-01-01: 75 mL via INTRAVENOUS

## 2022-01-01 NOTE — Research (Signed)
[employer] 2021-05 - Specimen Collection Study to Evaluate Biomarkers in Subjects with Cancer  ? ? ?This Nurse has reviewed this patient's inclusion and exclusion criteria as a second review and confirms Alexandra [name] is eligible for study participation.  Patient may continue with enrollment. ? ?Marjie Skiff. Lucetta Baehr, RN, BSN, CHPN ?She  Her  Hers ?Clinical Research Nurse ?[place] ?Direct Dial [phone]  Pager [phone] ?01/01/2022 2:27 PM ?

## 2022-01-01 NOTE — Progress Notes (Signed)
Multidisciplinary Oncology Council Documentation ? ?Alexandra [name] was presented by our [facility] on 01/01/2022, which included representatives from:  ?Palliative Care ?Dietitian  ?Physical/Occupational Therapist ?Nurse Navigator ?Genetics ?Speech Therapist ?Social work ?Survivorship RN ?Financial Navigator ?Research RN ? ? ?Dail currently presents with history of thyroid cancer. ? ?We reviewed previous medical and familial history, history of present illness, and recent lab results along with all available histopathologic and imaging studies. The [place] considered available treatment options and made the following recommendations/referrals: ? ?- None at this time ? ?The MOC is a meeting of clinicians from various specialty areas who evaluate and discuss patients for whom a multidisciplinary approach is being considered. Final determinations in the plan of care are those of the provider(s).  ? ?Today's extended care, comprehensive team conference, Alexandra [name] was not present for the discussion and was not examined.   ?

## 2022-01-01 NOTE — Research (Addendum)
This patient reports past medical history of ovarian cysts and palpable thyroid mass, and she denies past medical history of hypertension, CAD, Lupus, RA, Diabetes or Lynch Syndrome.  This patient is not taking magnesium supplements.  The patient does report family history of cancer in 1st or 2nd degree relatives.  Her brother had kidney cancer.  The patient reports no history of alcohol consumption.  The patient reports no history of tobacco use.  ?Jeral Fruit, RN ?01/01/22 ?4:23 PM ? ?Patient is scheduled for a total thyroidectomy on 04 Feb 2022. Research RN will request tissue for [employer] from this procedure for protocol requirements.  ?Jeral Fruit, RN ?01/02/22 ?2:48 PM ? ?

## 2022-01-01 NOTE — Research (Signed)
? ?  Trial Name:  [employer] 2021-05 Specimen Collection Study to Evaluate Biomarkers in Subjects with Cancer ? ?Patient Alexandra [name] was identified by Jeral Fruit, RN as a potential candidate for the above listed study.  This Clinical Research Nurse met with [name], [id number] on 01/01/22 in a manner and location that ensures patient privacy to discuss participation in the above listed research study.  Patient is Unaccompanied.  Patient was previously provided with informed consent documents.  Patient confirmed they have read the informed consent documents. ? ?As outlined in the informed consent form, this Nurse and [name] discussed the purpose of the research study, the investigational nature of the study, study procedures and requirements for study participation, potential risks and benefits of study participation, as well as alternatives to participation.  This study is not blinded or double-blinded. The patient understands participation is voluntary and they may withdraw from study participation at any time.  This study does not involve randomization.  This study does not involve an investigational drug or device. This study does not involve a placebo. Patient understands enrollment is pending full eligibility review.  ? ?Confidentiality and how the patient's information will be used as part of study participation were discussed.  Patient was informed there is reimbursement provided for their time and effort spent on trial participation.  The patient is encouraged to discuss research study participation with their insurance provider to determine what costs they may incur as part of study participation, including research related injury.   ? ?All questions were answered to patient's satisfaction.  The informed consent and separate HIPAA Authorization was reviewed page by page.  The patient's mental and emotional status is appropriate to provide informed consent, and the  patient verbalizes an understanding of study participation.  Patient has agreed to participate in the above listed research study and has voluntarily signed the informed consent version 14 dated 28 Oct 2021 and separate HIPAA Authorization, version 14  on 01/01/22 at 210PM.  The patient was provided with a copy of the signed informed consent form and separate HIPAA Authorization for their reference.  No study specific procedures were obtained prior to the signing of the informed consent document.  Approximately 20 minutes were spent with the patient reviewing the informed consent documents.  After obtaining informed consent patient, voluntarily signed the optional Release of Information form for use throughout trial participation.  ?Second eligibility verification was completed by research nurse [name], RN. Dr. [name] agrees with patient enrollment.  ?Patient was escorted to the phlebotomy room for her research protocol lab draw by [name], [name]. The patient is aware the she will receive a $50 [employer] gift card after her blood is obtained today.  ? ?Jeral Fruit, RN ?01/01/22 ?2:22 PM ? ? ?

## 2022-01-15 ENCOUNTER — Ambulatory Visit: Payer: BC Managed Care – PPO | Admitting: Physician Assistant

## 2022-01-15 ENCOUNTER — Encounter: Payer: Self-pay | Admitting: Physician Assistant

## 2022-01-15 NOTE — Progress Notes (Signed)
Patient seen today to check in on BP after elevated reading at last office visit. States she has not been able to check at home. ?Blood pressure 116/80, weight 128 lb 3.2 oz (58.2 kg).  ?-- ?In range, okay to follow up at physical.  ? ?I, [name], PA-C have reviewed all documentation for this visit. The documentation on  01/15/2022 for the exam, diagnosis, procedures, and orders are all accurate and complete. ? ?[name], PA-C ?[place] ?[id number] ?[place], [place], [address] ?Office: [phone] ?Fax: [phone]  ?

## 2022-01-27 ENCOUNTER — Encounter
Admission: RE | Admit: 2022-01-27 | Discharge: 2022-01-27 | Disposition: A | Discharge status: Home / Self Care | Payer: BC Managed Care – PPO | Source: Ambulatory Visit | Attending: Unknown Physician Specialty | Admitting: Unknown Physician Specialty

## 2022-01-27 DIAGNOSIS — Z01818 Encounter for other preprocedural examination: Secondary | ICD-10-CM

## 2022-01-27 DIAGNOSIS — D649 Anemia, unspecified: Secondary | ICD-10-CM

## 2022-01-27 HISTORY — DX: Anogenital (venereal) warts: A63.0

## 2022-01-27 HISTORY — DX: Personal history of urinary calculi: Z87.442

## 2022-01-27 HISTORY — DX: Hyperlipidemia, unspecified: E78.5

## 2022-01-27 HISTORY — DX: Unspecified ovarian cyst, unspecified side: N83.209

## 2022-01-27 HISTORY — DX: Malignant neoplasm of thyroid gland: C73

## 2022-01-27 HISTORY — DX: Papillomavirus as the cause of diseases classified elsewhere: B97.7

## 2022-01-27 HISTORY — DX: Elevated blood-pressure reading, without diagnosis of hypertension: R03.0

## 2022-01-27 NOTE — Patient Instructions (Addendum)
Your procedure is scheduled on:02-04-22 Tuesday ?Report to the Registration Desk on the 1st floor of the [facility].Then proceed to the 2nd floor Surgery Desk ?To find out your arrival time, please call [phone] between 1PM - 3PM on:02-03-22 Monday ?If your arrival time is 6:00 am, do not arrive prior to that time as the [facility] entrance doors do not open until 6:00 am. ? ?REMEMBER: ?Instructions that are not followed completely may result in serious medical risk, up to and including death; or upon the discretion of your surgeon and anesthesiologist your surgery may need to be rescheduled. ? ?Do not eat food after midnight the night before surgery.  ?No gum chewing, lozengers or hard candies. ? ?You may however, drink CLEAR liquids up to 2 hours before you are scheduled to arrive for your surgery. Do not drink anything within 2 hours of your scheduled arrival time. ? ?Clear liquids include: ?- water  ?- apple juice without pulp ?- gatorade (not RED colors) ?- black coffee or tea (Do NOT add milk or creamers to the coffee or tea) ?Do NOT drink anything that is not on this list ? ?Take the following medication the day of surgery with a small sip of water: ?-norethindrone (MICRONOR) ? ?One week prior to surgery: ?Stop Anti-inflammatories (NSAIDS) such as Advil, Aleve, Ibuprofen, Motrin, Naproxen, Naprosyn and Aspirin based products such as Excedrin, Goodys Powder, BC Powder.You may however, take Tylenol if needed for pain up until the day of surgery. ?Stop ANY OVER THE COUNTER supplements until after surgery NOW 01-27-22 (Fiber gummies) ? ?No Alcohol for 24 hours before or after surgery. ? ?No Smoking including e-cigarettes for 24 hours prior to surgery.  ?No chewable tobacco products for at least 6 hours prior to surgery.  ?No nicotine patches on the day of surgery. ? ?Do not use any "recreational" drugs for at least a week prior to your surgery.  ?Please be advised that the combination of cocaine and  anesthesia may have negative outcomes, up to and including death. ?If you test positive for cocaine, your surgery will be cancelled. ? ?On the morning of surgery brush your teeth with toothpaste and water, you may rinse your mouth with mouthwash if you wish. ?Do not swallow any toothpaste or mouthwash. ? ?Use CHG Soap as directed on instruction sheet. ? ?Do not wear jewelry, make-up, hairpins, clips or nail polish. ? ?Do not wear lotions, powders, or perfumes.  ? ?Do not shave body from the neck down 48 hours prior to surgery just in case you cut yourself which could leave a site for infection.  ?Also, freshly shaved skin may become irritated if using the CHG soap. ? ?Contact lenses, hearing aids and dentures may not be worn into surgery. ? ?Do not bring valuables to the hospital. [facility] is not responsible for any missing/lost belongings or valuables.  ? ?Notify your doctor if there is any change in your medical condition (cold, fever, infection). ? ?Wear comfortable clothing (specific to your surgery type) to the hospital. ? ?After surgery, you can help prevent lung complications by doing breathing exercises.  ?Take deep breaths and cough every 1-2 hours. Your doctor may order a device called an Incentive Spirometer to help you take deep breaths. ?When coughing or sneezing, hold a pillow firmly against your incision with both hands. This is called ?splinting.? Doing this helps protect your incision. It also decreases belly discomfort. ? ?If you are being admitted to the hospital overnight, leave your suitcase  in the car. ?After surgery it may be brought to your room. ? ?If you are being discharged the day of surgery, you will not be allowed to drive home. ?You will need a responsible adult (18 years or older) to drive you home and stay with you that night.  ? ?If you are taking public transportation, you will need to have a responsible adult (18 years or older) with you. ?Please confirm with your physician  that it is acceptable to use public transportation.  ? ?Please call the [place] Dept. at [phone] if you have any questions about these instructions. ? ?Surgery Visitation Policy: ? ?Patients undergoing a surgery or procedure may have two family members or support persons with them as long as the person is not COVID-19 positive or experiencing its symptoms.  ? ?Inpatient Visitation:   ? ?Visiting hours are 7 a.m. to 8 p.m. ?Up to four visitors are allowed at one time in a patient room, including children. The visitors may rotate out with other people during the day. One designated support person (adult) may remain overnight.  ?

## 2022-01-28 ENCOUNTER — Encounter
Admission: RE | Admit: 2022-01-28 | Discharge: 2022-01-28 | Disposition: A | Discharge status: Home / Self Care | Payer: BC Managed Care – PPO | Source: Ambulatory Visit | Attending: Unknown Physician Specialty | Admitting: Unknown Physician Specialty

## 2022-01-28 DIAGNOSIS — Z01818 Encounter for other preprocedural examination: Secondary | ICD-10-CM

## 2022-01-28 DIAGNOSIS — Z01812 Encounter for preprocedural laboratory examination: Secondary | ICD-10-CM | POA: Diagnosis not present

## 2022-01-28 LAB — CBC
HCT: 40.2 % (ref 36.0–46.0)
Hemoglobin: 13.2 g/dL (ref 12.0–15.0)
MCH: 29 pg (ref 26.0–34.0)
MCHC: 32.8 g/dL (ref 30.0–36.0)
MCV: 88.4 fL (ref 80.0–100.0)
Platelets: 307 10*3/uL (ref 150–400)
RBC: 4.55 MIL/uL (ref 3.87–5.11)
RDW: 12.2 % (ref 11.5–15.5)
WBC: 8.1 10*3/uL (ref 4.0–10.5)
nRBC: 0 % (ref 0.0–0.2)

## 2022-02-04 ENCOUNTER — Observation Stay
Admission: RE | Admit: 2022-02-04 | Discharge: 2022-02-06 | Disposition: A | Discharge status: Home / Self Care | Payer: BC Managed Care – PPO | Source: Ambulatory Visit | Attending: Unknown Physician Specialty | Admitting: Unknown Physician Specialty

## 2022-02-04 ENCOUNTER — Ambulatory Visit: Payer: BC Managed Care – PPO | Admitting: Anesthesiology

## 2022-02-04 ENCOUNTER — Ambulatory Visit: Payer: BC Managed Care – PPO | Admitting: Urgent Care

## 2022-02-04 ENCOUNTER — Other Ambulatory Visit: Payer: Self-pay

## 2022-02-04 ENCOUNTER — Encounter
Admission: RE | Disposition: A | Discharge status: Home / Self Care | Payer: Self-pay | Source: Ambulatory Visit | Attending: Unknown Physician Specialty

## 2022-02-04 ENCOUNTER — Encounter: Payer: Self-pay | Admitting: Unknown Physician Specialty

## 2022-02-04 DIAGNOSIS — C73 Malignant neoplasm of thyroid gland: Principal | ICD-10-CM | POA: Insufficient documentation

## 2022-02-04 DIAGNOSIS — E890 Postprocedural hypothyroidism: Secondary | ICD-10-CM

## 2022-02-04 HISTORY — PX: THYROIDECTOMY: SHX17

## 2022-02-04 LAB — CALCIUM
Calcium: 9.2 mg/dL (ref 8.9–10.3)
Calcium: 8.6 mg/dL — ABNORMAL LOW (ref 8.9–10.3)

## 2022-02-04 LAB — POCT PREGNANCY, URINE: Preg Test, Ur: NEGATIVE

## 2022-02-04 SURGERY — THYROIDECTOMY
Anesthesia: General | Site: Neck | Laterality: Bilateral

## 2022-02-04 MED ORDER — GABAPENTIN 300 MG PO CAPS: 300.0000 mg | ORAL_CAPSULE | Freq: Once | ORAL | Status: AC

## 2022-02-04 MED ORDER — ONDANSETRON HCL 4 MG/2ML IJ SOLN
INTRAMUSCULAR | Status: DC | PRN
  Administered 2022-02-04: 4 mg via INTRAVENOUS

## 2022-02-04 MED ORDER — ONDANSETRON HCL 4 MG/2ML IJ SOLN: 4.0000 mg | INTRAMUSCULAR | Status: DC | PRN

## 2022-02-04 MED ORDER — PROMETHAZINE HCL 25 MG/ML IJ SOLN: 6.2500 mg | INTRAMUSCULAR | Status: DC | PRN

## 2022-02-04 MED ORDER — LIDOCAINE HCL (CARDIAC) PF 100 MG/5ML IV SOSY
PREFILLED_SYRINGE | INTRAVENOUS | Status: DC | PRN
Start: 2022-02-04 — End: 2022-02-04
  Administered 2022-02-04: 100 mg via INTRAVENOUS

## 2022-02-04 MED ORDER — SUCCINYLCHOLINE CHLORIDE 200 MG/10ML IV SOSY
PREFILLED_SYRINGE | INTRAVENOUS | Status: AC
Start: 2022-02-04 — End: ?
  Filled 2022-02-04: qty 10

## 2022-02-04 MED ORDER — OXYCODONE HCL 5 MG PO TABS
ORAL_TABLET | ORAL | Status: AC
  Filled 2022-02-04: qty 1

## 2022-02-04 MED ORDER — KETAMINE HCL 10 MG/ML IJ SOLN
INTRAMUSCULAR | Status: DC | PRN
  Administered 2022-02-04: 10 mg via INTRAVENOUS
  Administered 2022-02-04: 20 mg via INTRAVENOUS

## 2022-02-04 MED ORDER — ACETAMINOPHEN 650 MG RE SUPP: 650.0000 mg | RECTAL | Status: DC | PRN

## 2022-02-04 MED ORDER — LIDOCAINE HCL (PF) 2 % IJ SOLN
INTRAMUSCULAR | Status: AC
  Filled 2022-02-04: qty 5

## 2022-02-04 MED ORDER — OXYCODONE HCL 5 MG/5ML PO SOLN: 5.0000 mg | Freq: Once | ORAL | Status: AC | PRN

## 2022-02-04 MED ORDER — FENTANYL CITRATE (PF) 100 MCG/2ML IJ SOLN
INTRAMUSCULAR | Status: AC
  Filled 2022-02-04: qty 2

## 2022-02-04 MED ORDER — OXYCODONE HCL 5 MG PO TABS
5.0000 mg | ORAL_TABLET | Freq: Once | ORAL | Status: AC | PRN
  Administered 2022-02-04: 5 mg via ORAL

## 2022-02-04 MED ORDER — CHLORHEXIDINE GLUCONATE 0.12 % MT SOLN
OROMUCOSAL | Status: AC
  Administered 2022-02-04: 15 mL via OROMUCOSAL
  Filled 2022-02-04: qty 15

## 2022-02-04 MED ORDER — FOLIC ACID 1 MG PO TABS
1.0000 mg | ORAL_TABLET | Freq: Every day | ORAL | Status: DC
Start: 2022-02-04 — End: 2022-02-06
  Administered 2022-02-04 – 2022-02-06 (×3): 1 mg via ORAL
  Filled 2022-02-04 (×3): qty 1

## 2022-02-04 MED ORDER — HYDROCODONE-ACETAMINOPHEN 5-325 MG PO TABS
1.0000 | ORAL_TABLET | ORAL | Status: DC | PRN
  Administered 2022-02-04: 2 via ORAL
  Filled 2022-02-04 (×3): qty 2

## 2022-02-04 MED ORDER — PROPOFOL 500 MG/50ML IV EMUL
INTRAVENOUS | Status: AC
  Filled 2022-02-04: qty 100

## 2022-02-04 MED ORDER — BACITRACIN ZINC 500 UNIT/GM EX OINT
1.0000 "application " | TOPICAL_OINTMENT | Freq: Three times a day (TID) | CUTANEOUS | Status: DC
  Administered 2022-02-04 – 2022-02-06 (×6): 1 via TOPICAL
  Filled 2022-02-04 (×5): qty 0.9

## 2022-02-04 MED ORDER — PHENYLEPHRINE HCL-NACL 20-0.9 MG/250ML-% IV SOLN
INTRAVENOUS | Status: DC | PRN
  Administered 2022-02-04: 10 ug/min via INTRAVENOUS

## 2022-02-04 MED ORDER — PROPOFOL 10 MG/ML IV BOLUS
INTRAVENOUS | Status: AC
  Filled 2022-02-04: qty 40

## 2022-02-04 MED ORDER — REMIFENTANIL HCL 1 MG IV SOLR
INTRAVENOUS | Status: AC
  Filled 2022-02-04: qty 1000

## 2022-02-04 MED ORDER — MIDAZOLAM HCL 2 MG/2ML IJ SOLN
INTRAMUSCULAR | Status: AC
  Filled 2022-02-04: qty 2

## 2022-02-04 MED ORDER — ACETAMINOPHEN 160 MG/5ML PO SOLN
650.0000 mg | ORAL | Status: DC | PRN
  Administered 2022-02-05: 650 mg via ORAL
  Filled 2022-02-04 (×2): qty 20.3

## 2022-02-04 MED ORDER — MIDAZOLAM HCL 2 MG/2ML IJ SOLN
INTRAMUSCULAR | Status: DC | PRN
  Administered 2022-02-04: 2 mg via INTRAVENOUS

## 2022-02-04 MED ORDER — CALCIUM POLYCARBOPHIL 625 MG PO TABS
625.0000 mg | ORAL_TABLET | Freq: Every day | ORAL | Status: DC
  Administered 2022-02-04 – 2022-02-06 (×3): 625 mg via ORAL
  Filled 2022-02-04 (×3): qty 1

## 2022-02-04 MED ORDER — 0.9 % SODIUM CHLORIDE (POUR BTL) OPTIME
TOPICAL | Status: DC | PRN
Start: 2022-02-04 — End: 2022-02-04
  Administered 2022-02-04: 500 mL

## 2022-02-04 MED ORDER — PROPOFOL 500 MG/50ML IV EMUL
INTRAVENOUS | Status: DC | PRN
  Administered 2022-02-04: 150 ug/kg/min via INTRAVENOUS

## 2022-02-04 MED ORDER — ORAL CARE MOUTH RINSE: 15.0000 mL | Freq: Once | OROMUCOSAL | Status: AC

## 2022-02-04 MED ORDER — ACETAMINOPHEN 500 MG PO TABS: 1000.0000 mg | ORAL_TABLET | Freq: Once | ORAL | Status: AC

## 2022-02-04 MED ORDER — NORETHINDRONE 0.35 MG PO TABS
1.0000 | ORAL_TABLET | Freq: Every day | ORAL | Status: DC
  Administered 2022-02-04: 0.35 mg via ORAL
  Filled 2022-02-04 (×6): qty 1

## 2022-02-04 MED ORDER — ACETAMINOPHEN 500 MG PO TABS
ORAL_TABLET | ORAL | Status: AC
  Administered 2022-02-04: 1000 mg via ORAL
  Filled 2022-02-04: qty 2

## 2022-02-04 MED ORDER — ONDANSETRON HCL 4 MG/2ML IJ SOLN
INTRAMUSCULAR | Status: AC
  Filled 2022-02-04: qty 2

## 2022-02-04 MED ORDER — EPHEDRINE SULFATE (PRESSORS) 50 MG/ML IJ SOLN
INTRAMUSCULAR | Status: DC | PRN
  Administered 2022-02-04: 2.5 mg via INTRAVENOUS
  Administered 2022-02-04: 7.5 mg via INTRAVENOUS

## 2022-02-04 MED ORDER — FAMOTIDINE 20 MG PO TABS
ORAL_TABLET | ORAL | Status: AC
  Administered 2022-02-04: 20 mg via ORAL
  Filled 2022-02-04: qty 1

## 2022-02-04 MED ORDER — DEXTROSE-NACL 5-0.45 % IV SOLN: INTRAVENOUS | Status: DC

## 2022-02-04 MED ORDER — FENTANYL CITRATE (PF) 100 MCG/2ML IJ SOLN
INTRAMUSCULAR | Status: DC | PRN
  Administered 2022-02-04 (×2): 50 ug via INTRAVENOUS

## 2022-02-04 MED ORDER — DEXAMETHASONE SODIUM PHOSPHATE 10 MG/ML IJ SOLN
INTRAMUSCULAR | Status: DC | PRN
Start: 2022-02-04 — End: 2022-02-04
  Administered 2022-02-04: 10 mg via INTRAVENOUS

## 2022-02-04 MED ORDER — FAMOTIDINE 20 MG PO TABS: 20.0000 mg | ORAL_TABLET | Freq: Once | ORAL | Status: AC

## 2022-02-04 MED ORDER — HEMOSTATIC AGENTS (NO CHARGE) OPTIME
TOPICAL | Status: DC | PRN
  Administered 2022-02-04: 1 via TOPICAL

## 2022-02-04 MED ORDER — GABAPENTIN 600 MG PO TABS
300.0000 mg | ORAL_TABLET | Freq: Once | ORAL | Status: DC
Start: 2022-02-04 — End: 2022-02-04
  Administered 2022-02-04: 300 mg via ORAL
  Filled 2022-02-04: qty 0.5

## 2022-02-04 MED ORDER — CHLORHEXIDINE GLUCONATE 0.12 % MT SOLN: 15.0000 mL | Freq: Once | OROMUCOSAL | Status: AC

## 2022-02-04 MED ORDER — OYSTER SHELL CALCIUM/D3 500-5 MG-MCG PO TABS
ORAL_TABLET | ORAL | Status: AC
  Administered 2022-02-04: 2 via ORAL
  Filled 2022-02-04: qty 2

## 2022-02-04 MED ORDER — DROPERIDOL 2.5 MG/ML IJ SOLN: 0.6250 mg | Freq: Once | INTRAMUSCULAR | Status: DC | PRN

## 2022-02-04 MED ORDER — PHENYLEPHRINE 80 MCG/ML (10ML) SYRINGE FOR IV PUSH (FOR BLOOD PRESSURE SUPPORT)
PREFILLED_SYRINGE | INTRAVENOUS | Status: DC | PRN
  Administered 2022-02-04 (×3): 80 ug via INTRAVENOUS
  Administered 2022-02-04: 160 ug via INTRAVENOUS
  Administered 2022-02-04 (×8): 80 ug via INTRAVENOUS

## 2022-02-04 MED ORDER — DEXAMETHASONE SODIUM PHOSPHATE 10 MG/ML IJ SOLN
INTRAMUSCULAR | Status: AC
  Filled 2022-02-04: qty 1

## 2022-02-04 MED ORDER — GABAPENTIN 300 MG PO CAPS
ORAL_CAPSULE | ORAL | Status: AC
  Administered 2022-02-04: 300 mg via ORAL
  Filled 2022-02-04: qty 1

## 2022-02-04 MED ORDER — OYSTER SHELL CALCIUM/D3 500-5 MG-MCG PO TABS
2.0000 | ORAL_TABLET | Freq: Two times a day (BID) | ORAL | Status: DC
  Administered 2022-02-04 – 2022-02-05 (×2): 2 via ORAL
  Filled 2022-02-04 (×2): qty 2

## 2022-02-04 MED ORDER — NORETHINDRONE 0.35 MG PO TABS: 1.0000 | ORAL_TABLET | Freq: Every day | ORAL | Status: DC

## 2022-02-04 MED ORDER — LIDOCAINE-EPINEPHRINE 1 %-1:100000 IJ SOLN
INTRAMUSCULAR | Status: DC | PRN
  Administered 2022-02-04: 2.5 mL

## 2022-02-04 MED ORDER — REMIFENTANIL HCL 1 MG IV SOLR
INTRAVENOUS | Status: DC | PRN
  Administered 2022-02-04: .15 ug/kg/min via INTRAVENOUS

## 2022-02-04 MED ORDER — PROPOFOL 10 MG/ML IV BOLUS
INTRAVENOUS | Status: DC | PRN
Start: 2022-02-04 — End: 2022-02-04
  Administered 2022-02-04: 150 mg via INTRAVENOUS

## 2022-02-04 MED ORDER — ONDANSETRON HCL 4 MG PO TABS: 4.0000 mg | ORAL_TABLET | ORAL | Status: DC | PRN

## 2022-02-04 MED ORDER — FENTANYL CITRATE (PF) 100 MCG/2ML IJ SOLN: 25.0000 ug | INTRAMUSCULAR | Status: DC | PRN

## 2022-02-04 MED ORDER — LIDOCAINE-EPINEPHRINE 1 %-1:100000 IJ SOLN
INTRAMUSCULAR | Status: AC
  Filled 2022-02-04: qty 1

## 2022-02-04 MED ORDER — ACETAMINOPHEN 10 MG/ML IV SOLN: 1000.0000 mg | Freq: Once | INTRAVENOUS | Status: DC | PRN

## 2022-02-04 MED ORDER — SUCCINYLCHOLINE CHLORIDE 200 MG/10ML IV SOSY
PREFILLED_SYRINGE | INTRAVENOUS | Status: DC | PRN
  Administered 2022-02-04: 80 mg via INTRAVENOUS

## 2022-02-04 MED ORDER — KETAMINE HCL 50 MG/5ML IJ SOSY
PREFILLED_SYRINGE | INTRAMUSCULAR | Status: AC
  Filled 2022-02-04: qty 5

## 2022-02-04 MED ORDER — LACTATED RINGERS IV SOLN: INTRAVENOUS | Status: DC

## 2022-02-04 SURGICAL SUPPLY — 38 items
BLADE SURG 15 STRL LF DISP TIS (BLADE) ×1 IMPLANT
BLADE SURG 15 STRL SS (BLADE) ×2
BULB RESERV EVAC DRAIN JP 100C (MISCELLANEOUS) IMPLANT
CORD BIP STRL DISP 12FT (MISCELLANEOUS) ×2 IMPLANT
DERMABOND ADVANCED (GAUZE/BANDAGES/DRESSINGS) ×1
DERMABOND ADVANCED .7 DNX12 (GAUZE/BANDAGES/DRESSINGS) ×1 IMPLANT
DRAIN JP 10F RND SILICONE (MISCELLANEOUS) IMPLANT
DRAPE MAG INST 16X20 L/F (DRAPES) ×2 IMPLANT
DRSG TEGADERM 2-3/8X2-3/4 SM (GAUZE/BANDAGES/DRESSINGS) ×2 IMPLANT
ELECT CAUTERY BLADE TIP 2.5 (TIP) ×2
ELECT LARYNGEAL DUAL CHAN (ELECTRODE) ×2 IMPLANT
ELECT NEEDLE 20X.3 GREEN (MISCELLANEOUS) ×2
ELECT REM PT RETURN 9FT ADLT (ELECTROSURGICAL) ×2
ELECTRODE CAUTERY BLDE TIP 2.5 (TIP) ×1 IMPLANT
ELECTRODE NDL 20X.3 GREEN (MISCELLANEOUS) IMPLANT
ELECTRODE NEEDLE 20X.3 GREEN (MISCELLANEOUS) ×1 IMPLANT
ELECTRODE REM PT RTRN 9FT ADLT (ELECTROSURGICAL) ×1 IMPLANT
FORCEPS JEWEL BIP 4-3/4 STR (INSTRUMENTS) ×2 IMPLANT
GAUZE 4X4 16PLY ~~LOC~~+RFID DBL (SPONGE) ×4 IMPLANT
GLOVE BIO SURGEON STRL SZ7.5 (GLOVE) ×4 IMPLANT
GOWN STRL REUS W/ TWL LRG LVL3 (GOWN DISPOSABLE) ×3 IMPLANT
GOWN STRL REUS W/TWL LRG LVL3 (GOWN DISPOSABLE) ×6
HEMOSTAT SURGICEL 2X3 (HEMOSTASIS) ×2 IMPLANT
KIT TURNOVER KIT A (KITS) ×2 IMPLANT
MANIFOLD NEPTUNE II (INSTRUMENTS) ×2 IMPLANT
NS IRRIG 500ML POUR BTL (IV SOLUTION) ×2 IMPLANT
PACK HEAD/NECK (MISCELLANEOUS) ×2 IMPLANT
PROBE NEUROSIGN BIPOL (MISCELLANEOUS) ×1 IMPLANT
PROBE NEUROSIGN BIPOLAR (MISCELLANEOUS) ×2
SHEARS HARMONIC 9CM CVD (BLADE) ×2 IMPLANT
SOL PREP PVP 2OZ (MISCELLANEOUS) ×2
SOLUTION PREP PVP 2OZ (MISCELLANEOUS) ×1 IMPLANT
SPONGE KITTNER 5P (MISCELLANEOUS) ×2 IMPLANT
STAPLER SKIN PROX 35W (STAPLE) ×2 IMPLANT
SUT SILK 2 0 (SUTURE) ×2
SUT SILK 2 0 SH (SUTURE) ×2 IMPLANT
SUT SILK 2-0 18XBRD TIE 12 (SUTURE) ×1 IMPLANT
SUT VIC AB 4-0 RB1 18 (SUTURE) ×4 IMPLANT

## 2022-02-04 NOTE — Op Note (Signed)
02/04/2022 ? ?9:34 AM ? ? ? ?Alexandra [name] ? ?[id number] ? ? ?Pre-Op Dx: MALIGNANT NEOPLASM, THYROID ? ?Post-op Dx: SAME ? ?Proc: Total thyroidectomy; recurrent laryngeal nerve monitoring 1 and half hours ? ?Surg:  Alexandra [name] ?  ?Assistant: Vaught ? ?Anes:  GOT ? ?EBL: Less than 20 cc ? ?Comp: None ? ?Findings: Firm mass in the isthmus of the thyroid ? ?Procedure: Alexandra [name] was identified in the holding area taken the operating room placed in supine position.  The patient was then intubated with a tracheal tube with a laryngeal nerve monitoring device.  This remained on throughout the procedure.  With the intubation completed and the patient sedated the neck was gently extended.  The mass was easily identified in the isthmus of the thyroid.  Incision line was marked overlying the mass a local anesthetic 1% lidocaine with 1 1000 units epinephrine is used to inject over the mass a total of 2 cc was used.  With the neck prepped and draped sterilely 15 blade was used to incise down to and through the platysma muscle hemostasis achieved using the Bovie cautery.  Strap muscles were identified in the midline and divided using the Bovie cautery.  Beginning on the left-hand side the strap muscles were retracted laterally the left lobe of the gland was easily identified.  The strap muscles were retracted and the superior pole was identified the superior pole was isolated and the vessels were divided using the harmonic scalpel.  The left lobe was then gently medialized releasing any fibrous tissue using the harmonic scalpel.  The superior and inferior parathyroid glands were identified and left on their vascular pedicle.  The recurrent laryngeal nerve was identified in the tracheoesophageal groove was stimulated and remained intact throughout the case.  The nerve was dissected superiorly and inferiorly the gland was pulled medially and Berry's ligament was released.  With the left lobe released the right lobe was then  dissected again the strap muscles were retracted.  The superior pole vessels were isolated and divided using the harmonic scalpel.  The gland was then gently medialized any fibrous bands were released using the harmonic scalpel.  Again the superior inferior parathyroid glands were identified and preserved on their vascular pedicles.  The recurrent laryngeal nerve was identified in the tracheoesophageal groove this was stimulated and remained intact throughout the case again the nerve was dissected superiorly and inferiorly and the gland was released.  Berry's ligament was then released from the anterior trachea allowing the left lobe to free up.  The inferior pole vessels were then isolated and divided using harmonic scalpel on each side.  Attention was then turned to the isthmus which is where the bulk of the tumor lay.  The pretracheal fascia was divided and dissected onto the anterior tracheal wall.  Was an excellent plane between the isthmus and the trachea and this plane was followed over the course of the trachea inferiorly freeing up the correct the gland completely there was 1 anterior lymph node that appeared to be attached to the gland which we left attached divided its vascular supply using the harmonic scalpel the gland was then removed in its entirety.  A stitch was placed in the right upper pole.  The gland was then passed off.  The wound was then copes irrigated with saline there were several small bleeding points around Berry's ligament which were cauterized using the microbipolar.  This prevented any further bleeding the nerves were then stimulated on each side and  remained intact and easily stimulated.  Surgicel was then placed within each neurovascular bed on each side with no active bleeding the strap muscles were reapproximated in the midline using 4-0 Vicryl.  The platysma and subcutaneous tissues were closed using 4-0 Vicryl in the skin was closed using Dermabond.  The patient was then  returned to anesthesia where she was awakened in the operating room type cart in stable condition. ? ?Specimen: Total thyroid ? ?MA blood loss less than 20 cc ? ?Dispo:   Good  ? ?Plan: We will admit for overnight observation and calcium monitoring ? ?Alexandra [name] ? ?02/04/2022 ?9:34 AM ?  ?

## 2022-02-04 NOTE — Progress Notes (Signed)
02/04/2022 ?5:38 PM ? ?Alexandra [name], Alexandra [name] ?[id number] ? ?Post-Op Day 0-patient is feeling fine no complaints ? ? ? Temp:  [97.6 ?F (36.4 ?C)-98.5 ?F (36.9 ?C)] 97.9 ?F (36.6 ?C) (05/09 1508) ?Pulse Rate:  [84-101] 86 (05/09 1508) ?Resp:  [14-19] 16 (05/09 1508) ?BP: (108-136)/(66-97) 108/87 (05/09 1508) ?SpO2:  [94 %-100 %] 97 % (05/09 1508) ?Weight:  [58.2 kg] 58.2 kg (05/09 0701),  ? ? ? ?Intake/Output Summary (Last 24 hours) at 02/04/2022 1738 ?Last data filed at 02/04/2022 1525 ?Gross per 24 hour  ?Intake 947.28 ml  ?Output 10 ml  ?Net 937.28 ml  ? ? ?Results for orders placed or performed during the hospital encounter of 02/04/22 (from the past 24 hour(s))  ?Pregnancy, urine POC     Status: None  ? Collection Time: 02/04/22  6:27 AM  ?Result Value Ref Range  ? Preg Test, Ur NEGATIVE NEGATIVE  ?Calcium     Status: None  ? Collection Time: 02/04/22 10:02 AM  ?Result Value Ref Range  ? Calcium 9.2 8.9 - 10.3 mg/dL  ?Calcium     Status: Abnormal  ? Collection Time: 02/04/22  2:37 PM  ?Result Value Ref Range  ? Calcium 8.6 (L) 8.9 - 10.3 mg/dL  ? ? ?SUBJECTIVE: Awake and alert and not much pain ? ?OBJECTIVE: Incision clean and dry no evidence of hematoma ? ?IMPRESSION: Status post total thyroidectomy, is taking p.o. well, ? ?PLAN: Status post total thyroidectomy calcium and PACU 9.2 calcium at 5 PM 8.6.  Will saline lock the IV continue p.o. supplements of calcium repeat in a.m. if stabilizes, plan to discharge tomorrow.  Advised to ambulate as much as tolerated. ? ?[name] ?02/04/2022, 5:38 PM ? ?  ?

## 2022-02-04 NOTE — H&P (Signed)
The patient's history has been reviewed, patient examined, no change in status, stable for surgery.  Questions were answered to the patients satisfaction.  

## 2022-02-04 NOTE — Anesthesia Preprocedure Evaluation (Addendum)
Anesthesia Evaluation  ?Patient identified by MRN, date of birth, ID band ?Patient awake ? ? ? ?Reviewed: ?Allergy & Precautions, NPO status , Patient's Chart, lab work & pertinent test results ? ?Airway ?Mallampati: III ? ?TM Distance: >3 FB ?Neck ROM: full ? ? ? Dental ?no notable dental hx. ? ?  ?Pulmonary ?neg pulmonary ROS,  ?  ?Pulmonary exam normal ? ? ? ? ? ? ? Cardiovascular ?negative cardio ROS ?Normal cardiovascular exam ? ? ?  ?Neuro/Psych ?negative neurological ROS ? negative psych ROS  ? GI/Hepatic ?negative GI ROS, Neg liver ROS,   ?Endo/Other  ?negative endocrine ROS ? Renal/GU ?  ? ?  ?Musculoskeletal ? ? Abdominal ?Normal abdominal exam  (+)   ?Peds ? Hematology ?negative hematology ROS ?(+)   ?Anesthesia Other Findings ?Papillary carcinoma of the thyroid ? 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 ?Past Surgical History: ?10/1999: BREAST ENHANCEMENT SURGERY ?No date: BREAST SURGERY ?2009: CESAREAN SECTION ?    Comment:  TWINS ?No date: COSMETIC SURGERY ?02/03/2005: DILATION AND CURETTAGE OF UTERUS ?No date: FRACTURE SURGERY; Right ?    Comment:  middle finger ?2012: LEEP ?    Comment:  FOR CERVICAL DYSPLASIA ?2009: TUBAL LIGATION ? ? ? ? Reproductive/Obstetrics ?negative OB ROS ? ?  ? ? ? ? ? ? ? ? ? ? ? ? ? ?  ?  ? ? ? ? ? ? ? ?Anesthesia Physical ?Anesthesia Plan ? ?ASA: 2 ? ?Anesthesia Plan: General ETT and General  ? ?Post-op Pain Management: Tylenol PO (pre-op)*, Gabapentin PO (pre-op)* and Toradol IV (intra-op)*  ? ?Induction: Intravenous ? ?PONV Risk Score and Plan: 3 and Ondansetron, Dexamethasone and Midazolam ? ?Airway Management Planned: Oral ETT ? ?Additional  Equipment:  ? ?Intra-op Plan:  ? ?Post-operative Plan: Extubation in OR ? ?Informed Consent: I have reviewed the patients History and Physical, chart, labs and discussed the procedure including the risks, benefits and alternatives for the proposed anesthesia with the patient or authorized representative who has indicated his/her understanding and acceptance.  ? ? ? ?Dental Advisory Given ? ?Plan Discussed with: Anesthesiologist, CRNA and Surgeon ? ?Anesthesia Plan Comments:   ? ? ? ? ? ?Anesthesia Quick Evaluation ? ?

## 2022-02-04 NOTE — Anesthesia Procedure Notes (Signed)
Procedure Name: Intubation ?Date/Time: 02/04/2022 7:37 AM ?Performed by: Terrence Dupont, RN ?Pre-anesthesia Checklist: Patient identified, Emergency Drugs available, Suction available and Patient being monitored ?Patient Re-evaluated:Patient Re-evaluated prior to induction ?Oxygen Delivery Method: Circle system utilized ?Preoxygenation: Pre-oxygenation with 100% oxygen ?Induction Type: IV induction ?Ventilation: Mask ventilation without difficulty ?Laryngoscope Size: McGraph and 3 ?Grade View: Grade I ?Tube type: Oral (with nerve monitoring) ?Tube size: 7.0 mm ?Number of attempts: 1 ?Airway Equipment and Method: Stylet and Video-laryngoscopy ?Placement Confirmation: ETT inserted through vocal cords under direct vision, positive ETCO2 and breath sounds checked- equal and bilateral ?Tube secured with: Tape ?Dental Injury: Teeth and Oropharynx as per pre-operative assessment  ? ? ? ? ?

## 2022-02-04 NOTE — Progress Notes (Signed)
?  02/04/22 1300  ?Clinical Encounter Type  ?Visited With Patient and family together  ?Visit Type Initial  ?Referral From Nurse  ?Consult/Referral To Chaplain  ? ?Chaplain responded to nurse page. Chaplain met with patient and husband. Patient sleeping after surgery. Chaplain provided support and prayer for husband. ?

## 2022-02-04 NOTE — Transfer of Care (Signed)
Immediate Anesthesia Transfer of Care Note ? ?Patient: Alexandra [name] ? ?Procedure(s) Performed: TOTAL THYROIDECTOMY WITH LARYNGEAL NERVE MONITORING (Bilateral: Neck) ? ?Patient Location: PACU ? ?Anesthesia Type:General ? ?Level of Consciousness: drowsy and patient cooperative ? ?Airway & Oxygen Therapy: Patient Spontanous Breathing and Patient connected to face mask ? ?Post-op Assessment: Report given to RN and Post -op Vital signs reviewed and stable ? ?Post vital signs: Reviewed and stable ? ?Last Vitals:  ?Vitals Value Taken Time  ?BP 119/97 02/04/22 0935  ?Temp 36.5 ?C 02/04/22 0935  ?Pulse 99 02/04/22 0941  ?Resp 17 02/04/22 0941  ?SpO2 97 % 02/04/22 0941  ?Vitals shown include unvalidated device data. ? ?Last Pain:  ?Vitals:  ? 02/04/22 0935  ?TempSrc:   ?PainSc: 0-No pain  ?   ? ?  ? ?Complications: No notable events documented. ?

## 2022-02-05 ENCOUNTER — Encounter: Payer: Self-pay | Admitting: Unknown Physician Specialty

## 2022-02-05 DIAGNOSIS — C73 Malignant neoplasm of thyroid gland: Secondary | ICD-10-CM | POA: Diagnosis not present

## 2022-02-05 LAB — CALCIUM
Calcium: 8.4 mg/dL — ABNORMAL LOW (ref 8.9–10.3)
Calcium: 7.8 mg/dL — ABNORMAL LOW (ref 8.9–10.3)

## 2022-02-05 MED ORDER — CALCIUM CARBONATE 1250 (500 CA) MG PO TABS
2.0000 | ORAL_TABLET | Freq: Three times a day (TID) | ORAL | Status: DC
  Administered 2022-02-05 – 2022-02-06 (×4): 1000 mg via ORAL
  Filled 2022-02-05 (×4): qty 1

## 2022-02-05 NOTE — Clinical Social Work Note (Signed)
?  Transition of Care (TOC) Screening Note ? ? ?Patient Details  ?Name: Alexandra [name] ?Date of Birth: Oct 22, 1970 ? ? ?Transition of Care (TOC) CM/SW Contact:    ?Bruin Bolger A Maranda Marte, LCSW ?Phone Number:[phone] ?02/05/2022, 11:41 AM ? ? ? ?Transition of Care Department [facility]) has reviewed patient and no TOC needs have been identified at this time. We will continue to monitor patient advancement through interdisciplinary progression rounds. If new patient transition needs arise, please place a TOC consult. ?  ?

## 2022-02-05 NOTE — Progress Notes (Signed)
02/05/2022 ?7:59 AM ? ?Kristian, Hazzard ?[id number] ? ?Post-Op Day 1 ? ? ? Temp:  [97.6 ?F (36.4 ?C)-98.5 ?F (36.9 ?C)] 98.3 ?F (36.8 ?C) (05/10 0456) ?Pulse Rate:  [84-107] 107 (05/10 0456) ?Resp:  [14-19] 18 (05/10 0456) ?BP: (108-136)/(63-97) 109/63 (05/10 0456) ?SpO2:  [94 %-100 %] 99 % (05/10 0456),  ? ? ? ?Intake/Output Summary (Last 24 hours) at 02/05/2022 0759 ?Last data filed at 02/05/2022 2800 ?Gross per 24 hour  ?Intake 947.28 ml  ?Output 10 ml  ?Net 937.28 ml  ? ? ?Results for orders placed or performed during the hospital encounter of 02/04/22 (from the past 24 hour(s))  ?Calcium     Status: None  ? Collection Time: 02/04/22 10:02 AM  ?Result Value Ref Range  ? Calcium 9.2 8.9 - 10.3 mg/dL  ?Calcium     Status: Abnormal  ? Collection Time: 02/04/22  2:37 PM  ?Result Value Ref Range  ? Calcium 8.6 (L) 8.9 - 10.3 mg/dL  ?Calcium     Status: Abnormal  ? Collection Time: 02/05/22  6:05 AM  ?Result Value Ref Range  ? Calcium 8.4 (L) 8.9 - 10.3 mg/dL  ? ? ?SUBJECTIVE: Feeling fine this morning minimal pain. ? ?OBJECTIVE: Neck healing nicely wound clean and dry no evidence of hematoma. ? ?IMPRESSION: Status post total thyroidectomy.  Feeling better this morning.  Calcium yesterday afternoon was 8.6 this morning 8.4.  She has no symptoms of hypocalcemia. ? ?PLAN: Continue p.o. supplementation of Os-Cal with vitamin D, I will repeat her serum calcium level at approximately 2 PM this afternoon.  Possible discharge if calcium remains stable.  We will continue to follow.  Advised to ambulate in the hallways this morning. ? ?[name] ?02/05/2022, 7:59 AM ? ?  ?

## 2022-02-05 NOTE — Progress Notes (Signed)
Mobility Specialist - Progress Note ? ? 02/05/22 1400  ?Mobility  ?Activity Ambulated independently in hallway  ?Level of Assistance Modified independent, requires aide device or extra time  ?Assistive Device None  ?Distance Ambulated (ft) 300 ft  ?Activity Response Tolerated well  ?$Mobility charge 1 Mobility  ? ? ? ?Pt ambulates 311f in hallway ModI (extra time) and tolerates well. Pt is left sitting in bed with family at bedside. ? ?MMerrily Brittle?Mobility Specialist ?02/05/22, 2:26 PM ? ? ? ? ?

## 2022-02-05 NOTE — Anesthesia Postprocedure Evaluation (Signed)
Anesthesia Post Note ? ?Patient: MURRELL ELIZONDO ? ?Procedure(s) Performed: TOTAL THYROIDECTOMY WITH LARYNGEAL NERVE MONITORING (Bilateral: Neck) ? ?Patient location during evaluation: PACU ?Anesthesia Type: General ?Level of consciousness: awake and alert ?Pain management: pain level controlled ?Vital Signs Assessment: post-procedure vital signs reviewed and stable ?Respiratory status: spontaneous breathing, nonlabored ventilation and respiratory function stable ?Cardiovascular status: blood pressure returned to baseline and stable ?Postop Assessment: no apparent nausea or vomiting ?Anesthetic complications: no ? ? ?No notable events documented. ? ? ?Last Vitals:  ?Vitals:  ? 02/05/22 0456 02/05/22 0803  ?BP: 109/63 118/66  ?Pulse: (!) 107 86  ?Resp: 18 18  ?Temp: 36.8 ?C 36.8 ?C  ?SpO2: 99% 100%  ?  ?Last Pain:  ?Vitals:  ? 02/05/22 0733  ?TempSrc:   ?PainSc: 0-No pain  ? ? ?  ?  ?  ?  ?  ?  ? ?[place] Ouch ? ? ? ? ?

## 2022-02-06 DIAGNOSIS — C73 Malignant neoplasm of thyroid gland: Secondary | ICD-10-CM | POA: Diagnosis not present

## 2022-02-06 LAB — CALCIUM
Calcium: 7.4 mg/dL — ABNORMAL LOW (ref 8.9–10.3)
Calcium: 7.6 mg/dL — ABNORMAL LOW (ref 8.9–10.3)

## 2022-02-06 LAB — SURGICAL PATHOLOGY

## 2022-02-06 MED ORDER — CALCITRIOL 0.25 MCG PO CAPS
0.5000 ug | ORAL_CAPSULE | Freq: Two times a day (BID) | ORAL | Status: DC
  Administered 2022-02-06 (×2): 0.5 ug via ORAL
  Filled 2022-02-06 (×2): qty 2

## 2022-02-06 NOTE — Progress Notes (Signed)
PIV removed. AVS reviewed. Patient verbalizes understanding of medication regimen and necessary follow up appointments.  ?

## 2022-02-06 NOTE — Progress Notes (Signed)
Mobility Specialist - Progress Note ? ? 02/06/22 1300  ?Mobility  ?Activity Ambulated independently in hallway  ?Level of Assistance Modified independent, requires aide device or extra time  ?Assistive Device None  ?Distance Ambulated (ft) 300 ft  ?Activity Response Tolerated well  ?$Mobility charge 1 Mobility  ? ? ?Pt ambulates ModI d/t pain with slow controlled gait and HHA from family. Pt returns to bed with needs in reach. ? ?Merrily Brittle ?Mobility Specialist ?02/06/22, 2:06 PM ? ? ? ? ?

## 2022-02-06 NOTE — Discharge Summary (Signed)
02/06/2022 ?4:23 PM ? ?Aryiana, Klinkner ?[id number] ? ?Post-Op Day 2 ? ? ? Temp:  [97.9 ?F (36.6 ?C)-99.1 ?F (37.3 ?C)] 97.9 ?F (36.6 ?C) (05/11 1618) ?Pulse Rate:  [83-92] 92 (05/11 1618) ?Resp:  [16-18] 18 (05/11 1618) ?BP: (109-120)/(67-84) 111/84 (05/11 1618) ?SpO2:  [95 %-100 %] 95 % (05/11 1618),  ? ? ?No intake or output data in the 24 hours ending 02/06/22 1623 ? ?Results for orders placed or performed during the hospital encounter of 02/04/22 (from the past 24 hour(s))  ?Calcium     Status: Abnormal  ? Collection Time: 02/06/22  6:05 AM  ?Result Value Ref Range  ? Calcium 7.4 (L) 8.9 - 10.3 mg/dL  ?Calcium     Status: Abnormal  ? Collection Time: 02/06/22  2:31 PM  ?Result Value Ref Range  ? Calcium 7.6 (L) 8.9 - 10.3 mg/dL  ? ? ?SUBJECTIVE:  Feeling fine, no complaints ? ?OBJECTIVE:  Stable exam ? ?IMPRESSION:  Rocaltrol started this am in addition to calcium supplement.  Repeat Ca this afternoon trending in the positive direction at 7.6.   ? ?PLAN:  Ca stable/slightly increased, no symptoms.  Ionized Ca sent out to [facility] should be back tomorrow, but she is asymptomatic and serum Ca trending upward.  I think it is safe for her to go home.  She has appointment/lab draw with Dr. Chalmers Cater next Monday am.  We have discussed if she has any symptoms of hypocalcemia (we have discussed in detail) she is to go immediately to the ER for Ca level.  She understands.  Her husband is with her.  I've sent scripts to [place] for Strawberry, Vicodin, and Rocaltrol.  She will pick up on the way home.  I will see her back next week.  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  There was one parathyroid gland in/near the isthmus (the site of the cancer), and a small piece of a parathyroid left upper pole.  There remains at least 2.5 glands intact which are likely just stunned from the surgery.  I131 therapy will likely be required.   ? ?Alexandra [name] ?02/06/2022, 4:23 PM ? ?  ?

## 2022-02-06 NOTE — Progress Notes (Addendum)
02/06/2022 ?8:17 AM ? ?Alexandra, [name] ?[id number] ? ?Post-Op Day 2 ? ? ? Temp:  [98.4 ?F (36.9 ?C)-99.1 ?F (37.3 ?C)] 99.1 ?F (37.3 ?C) (05/11 0457) ?Pulse Rate:  [83-87] 83 (05/11 0457) ?Resp:  [16-18] 16 (05/11 0457) ?BP: (109-119)/(67-75) 109/67 (05/11 0457) ?SpO2:  [98 %-100 %] 98 % (05/11 0457),  ? ? ? ?Intake/Output Summary (Last 24 hours) at 02/06/2022 0817 ?Last data filed at 02/05/2022 1428 ?Gross per 24 hour  ?Intake 0 ml  ?Output --  ?Net 0 ml  ? ? ?Results for orders placed or performed during the hospital encounter of 02/04/22 (from the past 24 hour(s))  ?Calcium     Status: Abnormal  ? Collection Time: 02/05/22  2:20 PM  ?Result Value Ref Range  ? Calcium 7.8 (L) 8.9 - 10.3 mg/dL  ?Calcium     Status: Abnormal  ? Collection Time: 02/06/22  6:05 AM  ?Result Value Ref Range  ? Calcium 7.4 (L) 8.9 - 10.3 mg/dL  ? ? ?SUBJECTIVE:  Feeling fine, no  periororal tingling, no muscle cramping.  Has been ambulating without difficulty ? ?OBJECTIVE:  Neck-wound clean and dry, no hematoma.  Neuro-negative Chvostek sign, no muscle twitching or tingling. ? ?IMPRESSION:  Post op total thyroidectomy-path pending, serum Ca 7.4 this am.  Ionized Ca pending.  Patient is asymptomatic at present.  Oscal was switched yesterday to elemental Ca.  Will continue to follow.   ? ?PLAN:  Alexandra [name] was seen preop by Dr. Chalmers [name], endocrinologist in [place].  I will reach out to her for any advice regarding her hypocalcemia.  We felt confident the parathyroid glands were preserved during surgery, likely temporarily stunned.  Ionized Ca level pending.  Will follow.   ? ? ?I spoke with Dr. Chalmers [name],  plan is to add calcitriol .54mg bid and repeat Ca this afternoon.  If stable and/or increasing can discharge to home on these meds.  Dr. BChalmers Caterwill see her Monday and repeat labs.  Meds and labs ordered.   ?Alexandra [name]?02/06/2022, 8:17 AM ? ?  ?

## 2022-02-07 LAB — CALCIUM, IONIZED: Calcium, Ionized, Serum: 4.2 mg/dL — ABNORMAL LOW (ref 4.5–5.6)

## 2022-02-07 NOTE — Research (Signed)
Tumor tissue request sent to pathology with instructions for preparation from the protocol. ?Jeral Fruit, RN ?02/07/22 ?12:03 PM ? ?

## 2022-03-17 ENCOUNTER — Ambulatory Visit (HOSPITAL_COMMUNITY): Payer: BC Managed Care – PPO

## 2022-03-17 ENCOUNTER — Other Ambulatory Visit (HOSPITAL_COMMUNITY): Payer: Self-pay | Admitting: Endocrinology

## 2022-03-17 DIAGNOSIS — C73 Malignant neoplasm of thyroid gland: Secondary | ICD-10-CM

## 2022-03-17 NOTE — Written Directive (Cosign Needed)
MOLECULAR IMAGING AND THERAPEUTICS WRITTEN DIRECTIVE   PATIENT NAME: Alexandra [name]  PT DOB:   Jan 27, 1971                                              MRN: [id number]  ---------------------------------------------------------------------------------------------------------------------   I-131 THYROID CANCER THERAPY   RADIOPHARMACEUTICAL:  Iodine-131 Capsule    PRESCRIBED DOSE FOR ADMINISTRATION:    ROUTE OFADMINISTRATION:  PO   DIAGNOSIS:   REFERRING PHYSICIAN: Jacelyn Pi MD   THYROGEN STIMULATION OR HORMONE WITHDRAW: Thyrogen stimulation   REMNANT ABLATION OR ADJUVANT THERAPY:Remnant Ablation    DATE OF THYROIDECTOMY:02/04/22   SURGEON:   TSH:   Lab Results  Component Value Date   TSH 1.750 11/27/2021   TSH 4.190 10/29/2020     PRIOR I-131 THERAPY (Date and Dose):   Pathology:  Cell type: []   Papillary  []   Follicular  []   [place]   Largest tumor focus:      cm  Extrathyroidal Extension?     Yes []   No []     Lymphovascular Invasion?  Yes  []   No  []     Margins positive ? Yes []   No []     Lymph nodes positive? Yes []   No  []       # positive nodes:   # negative nodes:     TNM staging: pT:         PN:         Mx:    ADDITIONAL PHYSICIAN COMMENTS/NOTES   AUTHORIZED USER SIGNATURE & TIME STAMP:

## 2022-03-17 NOTE — Written Directive (Addendum)
MOLECULAR IMAGING AND THERAPEUTICS WRITTEN DIRECTIVE   PATIENT NAME: Alexandra [name]  PT DOB:   Oct 16, 1970                                              MRN: [id number]  ---------------------------------------------------------------------------------------------------------------------   I-131 THYROID CANCER THERAPY   RADIOPHARMACEUTICAL:  Iodine-131 Capsule    PRESCRIBED DOSE FOR ADMINISTRATION: 125 mCi   ROUTE OFADMINISTRATION:  PO   DIAGNOSIS: Papillary thyroid carcinoma  REFERRING PHYSICIAN:Bindubal Balan MD   THYROGEN STIMULATION OR HORMONE WITHDRAW: thyrogen stimulation   REMNANT ABLATION OR ADJUVANT THERAPY:Remnant ablation   DATE OF THYROIDECTOMY: 02/04/2022   SURGEON:   TSH:   Lab Results  Component Value Date   TSH 1.750 11/27/2021   TSH 4.190 10/29/2020     PRIOR I-131 THERAPY (Date and Dose):   Pathology:  Cell type: [x]   Papillary  []   Follicular  []   [place]   Largest tumor focus:2.4      cm  Extrathyroidal Extension?     Yes [x]   No []   (Strap muscle invasion)  Lymphovascular Invasion?  Yes  []   No  [x]     Margins positive ? Yes [x]   No []     Lymph nodes positive? Yes []   No  []       # positive nodes:  1 # negative nodes:     TNM staging: pT:    2     PN:  1       Mx:    ADDITIONAL PHYSICIAN COMMENTS/NOTES: Intermediate to high risk disease (postive node, positive margin, and invasion strap muscle)   AUTHORIZED USER SIGNATURE & TIME STAMP: Rennis Golden, MD   03/17/22    4:09 PM   .Stephannie Li

## 2022-03-19 ENCOUNTER — Ambulatory Visit (HOSPITAL_COMMUNITY)
Admission: RE | Admit: 2022-03-19 | Discharge: 2022-03-19 | Disposition: A | Discharge status: Home / Self Care | Payer: BC Managed Care – PPO | Source: Ambulatory Visit | Attending: Endocrinology | Admitting: Endocrinology

## 2022-03-19 DIAGNOSIS — C73 Malignant neoplasm of thyroid gland: Secondary | ICD-10-CM | POA: Diagnosis present

## 2022-03-19 MED ORDER — THYROTROPIN ALFA 0.9 MG IM SOLR
0.9000 mg | INTRAMUSCULAR | Status: AC
  Administered 2022-03-19: 0.9 mg via INTRAMUSCULAR

## 2022-03-19 MED ORDER — STERILE WATER FOR INJECTION IJ SOLN
INTRAMUSCULAR | Status: AC
  Filled 2022-03-19: qty 10

## 2022-03-20 ENCOUNTER — Encounter (HOSPITAL_COMMUNITY)
Admission: RE | Admit: 2022-03-20 | Discharge: 2022-03-20 | Disposition: A | Discharge status: Home / Self Care | Payer: BC Managed Care – PPO | Source: Ambulatory Visit | Attending: Endocrinology | Admitting: Endocrinology

## 2022-03-20 DIAGNOSIS — C73 Malignant neoplasm of thyroid gland: Secondary | ICD-10-CM | POA: Diagnosis not present

## 2022-03-20 MED ORDER — THYROTROPIN ALFA 0.9 MG IM SOLR
0.9000 mg | INTRAMUSCULAR | Status: AC
  Administered 2022-03-20: 0.9 mg via INTRAMUSCULAR

## 2022-03-20 MED ORDER — STERILE WATER FOR INJECTION IJ SOLN
INTRAMUSCULAR | Status: AC
  Filled 2022-03-20: qty 10

## 2022-03-21 ENCOUNTER — Encounter (HOSPITAL_COMMUNITY)
Admission: RE | Admit: 2022-03-21 | Discharge: 2022-03-21 | Disposition: A | Discharge status: Home / Self Care | Payer: BC Managed Care – PPO | Source: Ambulatory Visit | Attending: Endocrinology | Admitting: Endocrinology

## 2022-03-21 DIAGNOSIS — C73 Malignant neoplasm of thyroid gland: Secondary | ICD-10-CM | POA: Diagnosis not present

## 2022-03-21 LAB — HCG, SERUM, QUALITATIVE: Preg, Serum: NEGATIVE

## 2022-03-21 MED ORDER — SODIUM IODIDE I 131 CAPSULE
128.0000 | Freq: Once | INTRAVENOUS | Status: AC | PRN
  Administered 2022-03-21: 128 via ORAL

## 2022-03-26 ENCOUNTER — Encounter: Payer: Self-pay | Admitting: Physician Assistant

## 2022-03-26 ENCOUNTER — Telehealth: Payer: Self-pay

## 2022-03-26 NOTE — Telephone Encounter (Signed)
Copied from [place] [id number]. Topic: General - Other >> Mar 26, 2022 10:35 AM Marcellus Scott wrote: Reason for CRM: Pt stated she would like to come by and see Ria Comment and take a picture with her. Pt mentioned that Ria Comment agreed a few visits ago, but she doesn't want to take up her time; she just wants a picture and to thank her for saving her life.  Please advise.

## 2022-03-31 ENCOUNTER — Ambulatory Visit (HOSPITAL_COMMUNITY)
Admission: RE | Admit: 2022-03-31 | Discharge: 2022-03-31 | Disposition: A | Discharge status: Home / Self Care | Payer: BC Managed Care – PPO | Source: Ambulatory Visit | Attending: Endocrinology | Admitting: Endocrinology

## 2022-03-31 DIAGNOSIS — C73 Malignant neoplasm of thyroid gland: Secondary | ICD-10-CM | POA: Diagnosis not present

## 2022-08-20 ENCOUNTER — Encounter: Payer: Self-pay | Admitting: Unknown Physician Specialty

## 2022-08-20 ENCOUNTER — Encounter: Payer: Self-pay | Admitting: Anesthesiology

## 2022-08-28 ENCOUNTER — Ambulatory Visit
Admission: RE | Admit: 2022-08-28 | Payer: BC Managed Care – PPO | Source: Home / Self Care | Admitting: Unknown Physician Specialty

## 2022-08-28 ENCOUNTER — Encounter: Admission: RE | Payer: Self-pay | Source: Home / Self Care

## 2022-08-28 SURGERY — EXCISION MASS
Anesthesia: IV Sedation (MBSC Only)

## 2023-01-12 ENCOUNTER — Other Ambulatory Visit (HOSPITAL_COMMUNITY): Payer: Self-pay | Admitting: Endocrinology

## 2023-01-12 DIAGNOSIS — C73 Malignant neoplasm of thyroid gland: Secondary | ICD-10-CM

## 2023-01-12 NOTE — Written Directive (Cosign Needed)
MOLECULAR IMAGING AND THERAPEUTICS WRITTEN DIRECTIVE   PATIENT NAME: Alexandra [name]  PT DOB:   [birth year]                                              MRN: [id number]  ---------------------------------------------------------------------------------------------------------------------  I-131 WHOLE BODY SCAN    RADIOPHARMACEUTICAL: Iodine-131 Capsule for Diagnostic Imaging   PRESCRIBED DOSE FOR ADMINISTRATION: 4 mCi   ROUTE OFADMINISTRATION: PO   DIAGNOSIS: Papillary Thyroid Carcinoma   REFERRING PHYSICIAN:Bindubal Balan   THYROGEN STIMULATION OR HORMONE WITHDRAW: Thyrogen Stimulation   DATE OF THYROIDECTOMY:01/2022   SURGEON:N/A   TSH:   Lab Results  Component Value Date   TSH 1.750 11/27/2021   TSH 4.190 10/29/2020     PRIOR I-131 THERAPY (Date and Dose):   ADDITIONAL PHYSICIAN COMMENTS/NOTES   AUTHORIZED USER SIGNATURE & TIME STAMP: Patriciaann Clan, MD   01/13/23    10:12 AM

## 2023-01-26 ENCOUNTER — Encounter (HOSPITAL_COMMUNITY)
Admission: RE | Admit: 2023-01-26 | Discharge: 2023-01-26 | Disposition: A | Discharge status: Home / Self Care | Payer: BC Managed Care – PPO | Source: Ambulatory Visit | Attending: Endocrinology | Admitting: Endocrinology

## 2023-01-26 DIAGNOSIS — C73 Malignant neoplasm of thyroid gland: Secondary | ICD-10-CM | POA: Diagnosis present

## 2023-01-26 MED ORDER — THYROTROPIN ALFA 0.9 MG IM SOLR
0.9000 mg | INTRAMUSCULAR | Status: AC
  Administered 2023-01-26: 0.9 mg via INTRAMUSCULAR

## 2023-01-26 MED ORDER — STERILE WATER FOR INJECTION IJ SOLN
INTRAMUSCULAR | Status: AC
  Filled 2023-01-26: qty 10

## 2023-01-27 ENCOUNTER — Encounter (HOSPITAL_COMMUNITY)
Admission: RE | Admit: 2023-01-27 | Discharge: 2023-01-27 | Disposition: A | Discharge status: Home / Self Care | Payer: BC Managed Care – PPO | Source: Ambulatory Visit | Attending: Endocrinology | Admitting: Endocrinology

## 2023-01-27 DIAGNOSIS — C73 Malignant neoplasm of thyroid gland: Secondary | ICD-10-CM | POA: Diagnosis not present

## 2023-01-27 MED ORDER — STERILE WATER FOR INJECTION IJ SOLN
INTRAMUSCULAR | Status: AC
  Filled 2023-01-27: qty 10

## 2023-01-27 MED ORDER — THYROTROPIN ALFA 0.9 MG IM SOLR
0.9000 mg | INTRAMUSCULAR | Status: AC
  Administered 2023-01-27: 0.9 mg via INTRAMUSCULAR

## 2023-01-28 ENCOUNTER — Encounter (HOSPITAL_COMMUNITY)
Admission: RE | Admit: 2023-01-28 | Discharge: 2023-01-28 | Disposition: A | Discharge status: Home / Self Care | Payer: BC Managed Care – PPO | Source: Ambulatory Visit | Attending: Endocrinology | Admitting: Endocrinology

## 2023-01-28 DIAGNOSIS — C73 Malignant neoplasm of thyroid gland: Secondary | ICD-10-CM | POA: Diagnosis not present

## 2023-01-28 MED ORDER — SODIUM IODIDE I 131 CAPSULE
4.1000 | Freq: Once | INTRAVENOUS | Status: AC | PRN
  Administered 2023-01-28: 4.1 via ORAL

## 2023-01-30 ENCOUNTER — Encounter (HOSPITAL_COMMUNITY)
Admission: RE | Admit: 2023-01-30 | Discharge: 2023-01-30 | Disposition: A | Discharge status: Home / Self Care | Payer: BC Managed Care – PPO | Source: Ambulatory Visit | Attending: Endocrinology | Admitting: Endocrinology

## 2023-01-30 DIAGNOSIS — C73 Malignant neoplasm of thyroid gland: Secondary | ICD-10-CM | POA: Diagnosis not present

## 2023-02-23 ENCOUNTER — Encounter: Payer: Self-pay | Admitting: Physician Assistant

## 2023-02-24 ENCOUNTER — Other Ambulatory Visit: Payer: Self-pay | Admitting: Physician Assistant

## 2023-02-24 DIAGNOSIS — C73 Malignant neoplasm of thyroid gland: Secondary | ICD-10-CM

## 2023-02-24 DIAGNOSIS — E782 Mixed hyperlipidemia: Secondary | ICD-10-CM

## 2023-03-04 LAB — CBC WITH DIFFERENTIAL/PLATELET
Basophils Absolute: 0.1 10*3/uL (ref 0.0–0.2)
Basos: 1 %
EOS (ABSOLUTE): 0.1 10*3/uL (ref 0.0–0.4)
Eos: 2 %
Hematocrit: 39 % (ref 34.0–46.6)
Hemoglobin: 13.1 g/dL (ref 11.1–15.9)
Immature Grans (Abs): 0 10*3/uL (ref 0.0–0.1)
Immature Granulocytes: 0 %
Lymphocytes Absolute: 1.7 10*3/uL (ref 0.7–3.1)
Lymphs: 28 %
MCH: 29.5 pg (ref 26.6–33.0)
MCHC: 33.6 g/dL (ref 31.5–35.7)
MCV: 88 fL (ref 79–97)
Monocytes Absolute: 0.7 10*3/uL (ref 0.1–0.9)
Monocytes: 12 %
Neutrophils Absolute: 3.5 10*3/uL (ref 1.4–7.0)
Neutrophils: 57 %
Platelets: 294 10*3/uL (ref 150–450)
RBC: 4.44 x10E6/uL (ref 3.77–5.28)
RDW: 12.3 % (ref 11.7–15.4)
WBC: 6 10*3/uL (ref 3.4–10.8)

## 2023-03-04 LAB — LIPID PANEL
Chol/HDL Ratio: 2.7 ratio (ref 0.0–4.4)
Cholesterol, Total: 172 mg/dL (ref 100–199)
HDL: 63 mg/dL (ref >39)
LDL Chol Calc (NIH): 98 mg/dL (ref 0–99)
Triglycerides: 56 mg/dL (ref 0–149)
VLDL Cholesterol Cal: 11 mg/dL (ref 5–40)

## 2023-11-19 ENCOUNTER — Encounter: Payer: Self-pay | Admitting: Internal Medicine

## 2023-12-03 ENCOUNTER — Ambulatory Visit: Payer: Self-pay

## 2023-12-03 VITALS — Ht 65.0 in | Wt 135.8 lb

## 2023-12-03 DIAGNOSIS — Z1211 Encounter for screening for malignant neoplasm of colon: Secondary | ICD-10-CM

## 2023-12-03 MED ORDER — NA SULFATE-K SULFATE-MG SULF 17.5-3.13-1.6 GM/177ML PO SOLN: 1.0000 | Freq: Once | ORAL | 0 refills | Status: AC

## 2023-12-03 NOTE — Progress Notes (Signed)

## 2023-12-10 ENCOUNTER — Encounter: Payer: Self-pay | Admitting: Internal Medicine

## 2023-12-17 ENCOUNTER — Ambulatory Visit: Payer: Self-pay | Admitting: Internal Medicine

## 2023-12-17 ENCOUNTER — Encounter: Payer: Self-pay | Admitting: Internal Medicine

## 2023-12-17 VITALS — BP 134/82 | HR 84 | Temp 98.8°F | Resp 14 | Ht 65.0 in | Wt 135.8 lb

## 2023-12-17 DIAGNOSIS — D125 Benign neoplasm of sigmoid colon: Secondary | ICD-10-CM

## 2023-12-17 DIAGNOSIS — K648 Other hemorrhoids: Secondary | ICD-10-CM

## 2023-12-17 DIAGNOSIS — Z1211 Encounter for screening for malignant neoplasm of colon: Secondary | ICD-10-CM

## 2023-12-17 DIAGNOSIS — D122 Benign neoplasm of ascending colon: Secondary | ICD-10-CM

## 2023-12-17 DIAGNOSIS — K5730 Diverticulosis of large intestine without perforation or abscess without bleeding: Secondary | ICD-10-CM | POA: Diagnosis not present

## 2023-12-17 MED ORDER — SODIUM CHLORIDE 0.9 % IV SOLN: 500.0000 mL | Freq: Once | INTRAVENOUS | Status: DC

## 2023-12-17 NOTE — Op Note (Signed)
 [facility] Patient Name: Alexandra [name] Procedure Date: 12/17/2023 2:03 PM MRN: [id number] Endoscopist: Madelyn Brunner [place] , , [id number] Age: 53 Referring MD:  Date of Birth: 11-18-1970 Gender: Female Account #: [id number] Procedure:                Colonoscopy Indications:              Screening for colorectal malignant neoplasm, This                            is the patient's first colonoscopy Medicines:                Monitored Anesthesia Care Procedure:                Pre-Anesthesia Assessment:                           - Prior to the procedure, a History and Physical                            was performed, and patient medications and                            allergies were reviewed. The patient's tolerance of                            previous anesthesia was also reviewed. The risks                            and benefits of the procedure and the sedation                            options and risks were discussed with the patient.                            All questions were answered, and informed consent                            was obtained. Prior Anticoagulants: The patient has                            taken no anticoagulant or antiplatelet agents. ASA                            Grade Assessment: II - A patient with mild systemic                            disease. After reviewing the risks and benefits,                            the patient was deemed in satisfactory condition to                            undergo the procedure.  After obtaining informed consent, the colonoscope                            was passed under direct vision. Throughout the                            procedure, the patient's blood pressure, pulse, and                            oxygen saturations were monitored continuously. The                            PCF-HQ190L Colonoscope [id number] was introduced                            through the anus and  advanced to the the terminal                            ileum. The colonoscopy was performed without                            difficulty. The patient tolerated the procedure                            well. The quality of the bowel preparation was                            excellent. The terminal ileum, ileocecal valve,                            appendiceal orifice, and rectum were photographed. Scope In: 2:16:56 PM Scope Out: 2:34:06 PM Scope Withdrawal Time: 0 hours 11 minutes 40 seconds  Total Procedure Duration: 0 hours 17 minutes 10 seconds  Findings:                 The terminal ileum appeared normal.                           A 3 mm polyp was found in the ascending colon. The                            polyp was sessile. The polyp was removed with a                            cold snare. Resection and retrieval were complete.                           A 5 mm polyp was found in the sigmoid colon. The                            polyp was sessile. The polyp was removed with a  cold snare. Resection and retrieval were complete.                           A few diverticula were found in the sigmoid colon.                           Non-bleeding internal hemorrhoids were found during                            retroflexion. Complications:            No immediate complications. Estimated Blood Loss:     Estimated blood loss was minimal. Impression:               - The examined portion of the ileum was normal.                           - One 3 mm polyp in the ascending colon, removed                            with a cold snare. Resected and retrieved.                           - One 5 mm polyp in the sigmoid colon, removed with                            a cold snare. Resected and retrieved.                           - Diverticulosis in the sigmoid colon.                           - Non-bleeding internal hemorrhoids. Recommendation:           - Discharge  patient to home (with escort).                           - Await pathology results.                           - The findings and recommendations were discussed                            with the patient. Dr Particia Lather [address]" [place],  12/17/2023 2:38:23 PM

## 2023-12-17 NOTE — Progress Notes (Signed)
 Pt's states no medical or surgical changes since previsit or office visit.

## 2023-12-17 NOTE — Progress Notes (Signed)
 Called to room to assist during endoscopic procedure.  Patient ID and intended procedure confirmed with present staff. Received instructions for my participation in the procedure from the performing physician.

## 2023-12-17 NOTE — Patient Instructions (Signed)

## 2023-12-17 NOTE — Progress Notes (Signed)
 Report to PACU, RN, vss, BBS= Clear.

## 2023-12-17 NOTE — Progress Notes (Signed)
 GASTROENTEROLOGY PROCEDURE H&P NOTE   Primary Care Physician: [facility]    Reason for Procedure:   Colon cancer screening  Plan:    Colonoscopy  Patient is appropriate for endoscopic procedure(s) in the ambulatory ([facility]) setting.  The nature of the procedure, as well as the risks, benefits, and alternatives were carefully and thoroughly reviewed with the patient. Ample time for discussion and questions allowed. The patient understood, was satisfied, and agreed to proceed.     HPI: Alexandra [name] is a 53 y.o. female who presents for colonoscopy for colon cancer screening. Denies changes in bowel habits or unintentional weight loss. She has occasionally seen scant amounts of rectal bleeding every couple of months that she has attributed to an anal fissure. First cousin with rectal cancer. This is her first colonoscopy. She did a Cologuard test 3 years ago that was negative.   Past Medical History:  Diagnosis Date   Anemia    Anogenital (venereal) warts    Blood transfusion without reported diagnosis    Elevated blood pressure reading    History of kidney stones    HPV (human papilloma virus) infection    Hyperlipidemia    Ovarian cyst    Thyroid cancer [facility])     Past Surgical History:  Procedure Laterality Date   BREAST ENHANCEMENT SURGERY  10/1999   BREAST SURGERY     CESAREAN SECTION  2009   TWINS   COSMETIC SURGERY     DILATION AND CURETTAGE OF UTERUS  02/03/2005   FRACTURE SURGERY Right    middle finger   LEEP  2012   FOR CERVICAL DYSPLASIA   THYROIDECTOMY Bilateral 02/04/2022   Procedure: TOTAL THYROIDECTOMY WITH LARYNGEAL NERVE MONITORING;  Surgeon: Linus Salmons, MD;  Location: [facility] ORS;  Service: ENT;  Laterality: Bilateral;   TUBAL LIGATION  2009    Prior to Admission medications   Medication Sig Start Date End Date Taking? Authorizing Provider  calcitRIOL (ROCALTROL) 0.5 MCG capsule Take 0.5 mcg by mouth in the morning and at  bedtime.    [provider]  FIBER ADULT GUMMIES PO Take 2 tablets by mouth daily at 6 (six) AM.    [provider]  folic acid (FOLVITE) 1 MG tablet Take 1 mg by mouth daily. 12/26/19   [provider]  levothyroxine (SYNTHROID) 100 MCG tablet Take 100 mcg by mouth daily before breakfast. 4 times a week    [provider]  levothyroxine (SYNTHROID) 88 MCG tablet Take 88 mcg by mouth daily before breakfast. 3 days a week    [provider]  norethindrone (MICRONOR) 0.35 MG tablet Take 1 tablet by mouth daily.    [provider]    Current Outpatient Medications  Medication Sig Dispense Refill   calcitRIOL (ROCALTROL) 0.5 MCG capsule Take 0.5 mcg by mouth in the morning and at bedtime.     FIBER ADULT GUMMIES PO Take 2 tablets by mouth daily at 6 (six) AM.     folic acid (FOLVITE) 1 MG tablet Take 1 mg by mouth daily.     levothyroxine (SYNTHROID) 100 MCG tablet Take 100 mcg by mouth daily before breakfast. 4 times a week     levothyroxine (SYNTHROID) 88 MCG tablet Take 88 mcg by mouth daily before breakfast. 3 days a week     norethindrone (MICRONOR) 0.35 MG tablet Take 1 tablet by mouth daily.     Current Facility-Administered Medications  Medication Dose Route Frequency Provider Last  Rate Last Admin   0.9 %  sodium chloride infusion  500 mL Intravenous Once Imogene Burn, MD        Allergies as of 12/17/2023   (No Known Allergies)    Family History  Problem Relation Age of Onset   Hashimoto's thyroiditis Mother    Dementia Mother    Hypertension Mother    Hyperlipidemia Mother    Migraines Mother    Stroke Mother    Neurologic Disorder Father    Hyperlipidemia Brother    Heart disease Maternal Grandfather    Cancer Paternal Grandmother    Cancer Paternal Grandfather    Kidney cancer Son    Rectal cancer Cousin    Colon cancer Neg Hx    Stomach cancer Neg Hx    Colon polyps Neg Hx    Esophageal cancer Neg Hx      Social History   Socioeconomic History   Marital status: Married    Spouse name: Not on file   Number of children: Not on file   Years of education: Not on file   Highest education level: Not on file  Occupational History   Not on file  Tobacco Use   Smoking status: Never   Smokeless tobacco: Never  Vaping Use   Vaping status: Never Used  Substance and Sexual Activity   Alcohol use: No    Alcohol/week: 0.0 standard drinks of alcohol   Drug use: No   Sexual activity: Not on file  Other Topics Concern   Not on file  Social History Narrative   Not on file   Social Drivers of Health   Financial Resource Strain: Not on file  Food Insecurity: Not on file  Transportation Needs: Not on file  Physical Activity: Not on file  Stress: Not on file  Social Connections: Not on file  Intimate Partner Violence: Not on file    Physical Exam: Vital signs in last 24 hours: There were no vitals taken for this visit. GEN: NAD EYE: Sclerae anicteric ENT: MMM CV: Non-tachycardic Pulm: No increased work of breathing GI: Soft, NT/ND NEURO:  Alert & Oriented   Eulah Pont, MD [facility]  12/17/2023 1:56 PM

## 2023-12-18 ENCOUNTER — Telehealth: Payer: Self-pay | Admitting: *Deleted

## 2023-12-18 NOTE — Telephone Encounter (Signed)
  Follow up Call-     12/17/2023    1:56 PM  Call back number  Post procedure Call Back phone  # [phone]  Permission to leave phone message Yes     Patient questions:  Do you have a fever, pain , or abdominal swelling? No. Pain Score  0 *  Have you tolerated food without any problems? Yes.    Have you been able to return to your normal activities? Yes.    Do you have any questions about your discharge instructions: Diet   No. Medications  No. Follow up visit  No.  Do you have questions or concerns about your Care? No.  Actions: * If pain score is 4 or above: No action needed, pain <4.

## 2023-12-23 ENCOUNTER — Encounter: Payer: Self-pay | Admitting: Internal Medicine

## 2023-12-23 LAB — SURGICAL PATHOLOGY

## 2024-10-10 ENCOUNTER — Telehealth: Payer: Self-pay

## 2024-10-10 NOTE — Telephone Encounter (Signed)
 Copied from CRM #[id number]. Topic: Clinical - Medication Question >> Oct 10, 2024 11:43 AM Rosaria BRAVO wrote: Reason for CRM: Pt called requesting to see a provider, has been seen by a provider within the last 3 years and has not seen another PCP since. However, patient is showing a new patient. She is seeking medication for a plane ride coming up next month. Please advise   Best contact: [phone]

## 2024-10-24 ENCOUNTER — Ambulatory Visit: Admitting: Family Medicine

## 2024-10-24 DIAGNOSIS — E079 Disorder of thyroid, unspecified: Secondary | ICD-10-CM

## 2024-10-24 DIAGNOSIS — R030 Elevated blood-pressure reading, without diagnosis of hypertension: Secondary | ICD-10-CM

## 2024-10-24 DIAGNOSIS — Z23 Encounter for immunization: Secondary | ICD-10-CM

## 2024-10-24 DIAGNOSIS — C73 Malignant neoplasm of thyroid gland: Secondary | ICD-10-CM

## 2024-10-24 DIAGNOSIS — E782 Mixed hyperlipidemia: Secondary | ICD-10-CM

## 2024-10-24 DIAGNOSIS — Z1159 Encounter for screening for other viral diseases: Secondary | ICD-10-CM

## 2024-10-24 DIAGNOSIS — Z1231 Encounter for screening mammogram for malignant neoplasm of breast: Secondary | ICD-10-CM

## 2024-10-26 ENCOUNTER — Encounter: Payer: Self-pay | Admitting: Family Medicine

## 2024-10-26 ENCOUNTER — Ambulatory Visit: Admitting: Family Medicine

## 2024-10-26 VITALS — BP 109/65 | HR 81 | Resp 16 | Ht 65.0 in | Wt 138.0 lb

## 2024-10-26 DIAGNOSIS — G4725 Circadian rhythm sleep disorder, jet lag type: Secondary | ICD-10-CM

## 2024-10-26 DIAGNOSIS — F418 Other specified anxiety disorders: Secondary | ICD-10-CM | POA: Diagnosis not present

## 2024-10-26 MED ORDER — ALPRAZOLAM 0.5 MG PO TABS: 0.2500 mg | ORAL_TABLET | ORAL | 0 refills | Status: AC | PRN

## 2024-10-26 NOTE — Progress Notes (Signed)
 "     Established patient visit   Patient: Alexandra [name]   DOB: 09-06-1971   54 y.o. Female  MRN: [id number] Visit Date: 10/26/2024  Today's healthcare provider: Nancyann Perry, MD   Chief Complaint  Patient presents with   Medication Refill    Meds for anxiousness    Subjective    Discussed the use of AI scribe software for clinical note transcription with the patient, who gave verbal consent to proceed.  History of Present Illness   Alexandra [name] is a 54 year old female who presents with anxiety related to an upcoming international trip.  She experiences anxiety related to an upcoming trip to [place], scheduled in less than a month. Her concerns include the long duration of the flights, particularly one leg that is 'extremely long', and the potential for turbulence. Although she has flown internationally before, she feels nervous about this trip.  She inquires about medication options to manage her anxiety during the flights. She reports that she has previously taken medication to help with anxiety, though specific medications were not detailed.  Additionally, she is concerned about jet lag due to the destination being eight hours ahead. She mentions hearing about a medication that could help acclimate to the new time zone. She has previously used over-the-counter melatonin during a trip to [place] but did not find it effective as she took it an hour before boarding.  She plans to be overseas for seventeen days and is considering taking melatonin to help with jet lag. She does not consume alcohol and is concerned about potential interactions between anxiety medication and her malaria medication.       Medications: Show/hide medication list[1]       Objective    BP 109/65 (BP Location: Left Arm, Patient Position: Sitting, Cuff Size: Normal)   Pulse 81   Resp 16   Ht 5' 5 (1.651 m)   Wt 138 lb (62.6 kg)   SpO2 100%   BMI 22.96 kg/m   Physical Exam   General  appearance: Well developed, well nourished female, cooperative and in no acute distress Head: Normocephalic, without obvious abnormality, atraumatic Respiratory: Respirations even and unlabored, normal respiratory rate Extremities: All extremities are intact.  Skin: Skin color, texture, turgor normal. No rashes seen  Psych: Appropriate mood and affect. Neurologic: Mental status: Alert, oriented to person, place, and time, thought content appropriate.    Assessment & Plan       Flight-related situational anxiety Experiencing anxiety related to international travel, particularly long flights and turbulence. Alprazolam  preferred for its short duration of action. - Prescribed alprazolam  for flight-related anxiety, to be taken 30-45 minutes before departure and optionally at layover. - Advised against mixing alprazolam  with alcohol or sedating cold medicines. - Recommended starting melatonin 1-2 days before travel, taking 3 mg at destination's bedtime. - Advised continuing melatonin for 4-5 days after arrival to adjust to new time zone.    No follow-ups on file.     Nancyann Perry, MD  [facility] [phone] (phone) [phone] (fax)  [facility]     [1]  Outpatient Medications Prior to Visit  Medication Sig   calcitRIOL  (ROCALTROL ) 0.5 MCG capsule Take 0.5 mcg by mouth in the morning and at bedtime.   FIBER ADULT GUMMIES PO Take 2 tablets by mouth daily at 6 (six) AM.   folic acid  (FOLVITE ) 1 MG tablet Take 1 mg by mouth daily.   levothyroxine (SYNTHROID) 100  MCG tablet Take 100 mcg by mouth daily before breakfast. 4 times a week   levothyroxine (SYNTHROID) 88 MCG tablet Take 88 mcg by mouth daily before breakfast. 3 days a week   norethindrone  (MICRONOR ) 0.35 MG tablet Take 1 tablet by mouth daily.   No facility-administered medications prior to visit.   "

## 2024-12-19 ENCOUNTER — Encounter
